# Patient Record
Sex: Male | Born: 1965 | Race: White | Hispanic: No | Smoking: Never smoker
Health system: Southern US, Community
[De-identification: ages and names within clinical notes are randomized; demographics above are authoritative.]

## PROBLEM LIST (undated history)

## (undated) DIAGNOSIS — K8020 Calculus of gallbladder without cholecystitis without obstruction: Secondary | ICD-10-CM

## (undated) DIAGNOSIS — E785 Hyperlipidemia, unspecified: Secondary | ICD-10-CM

## (undated) DIAGNOSIS — Z91013 Allergy to seafood: Secondary | ICD-10-CM

## (undated) DIAGNOSIS — R1013 Epigastric pain: Secondary | ICD-10-CM

## (undated) DIAGNOSIS — J309 Allergic rhinitis, unspecified: Secondary | ICD-10-CM

## (undated) DIAGNOSIS — K219 Gastro-esophageal reflux disease without esophagitis: Secondary | ICD-10-CM

## (undated) HISTORY — DX: Gastro-esophageal reflux disease without esophagitis: K21.9

## (undated) HISTORY — PX: SEPTOPLASTY: SUR1290

## (undated) HISTORY — DX: Allergic rhinitis, unspecified: J30.9

## (undated) HISTORY — DX: Allergy to seafood: Z91.013

## (undated) HISTORY — DX: Epigastric pain: R10.13

## (undated) HISTORY — DX: Hyperlipidemia, unspecified: E78.5

## (undated) HISTORY — PX: WRIST FRACTURE SURGERY: SHX121

---

## 2002-05-07 ENCOUNTER — Encounter: Admission: RE | Admit: 2002-05-07 | Discharge: 2002-05-07 | Payer: Self-pay | Admitting: Family Medicine

## 2002-05-07 ENCOUNTER — Encounter: Payer: Self-pay | Admitting: Family Medicine

## 2005-01-25 ENCOUNTER — Encounter: Admission: RE | Admit: 2005-01-25 | Discharge: 2005-01-25 | Payer: Self-pay | Admitting: Family Medicine

## 2011-01-03 DIAGNOSIS — R1013 Epigastric pain: Secondary | ICD-10-CM

## 2011-01-03 HISTORY — DX: Epigastric pain: R10.13

## 2013-09-16 ENCOUNTER — Telehealth: Payer: Self-pay | Admitting: Internal Medicine

## 2013-09-16 NOTE — Telephone Encounter (Signed)
Caller name: Kevin Santos  Relation to pt: self  Call back number: [phone]   Reason for call:   pt gets hes 6 month liver function test pt is on "crestor" . Its been 9 month since pt last liver test. Pt has an upcoming New Patient Appointment 10/06/13   Dr. Charmian Muff old PCP

## 2013-09-16 NOTE — Telephone Encounter (Signed)
Pt is scheduled for NP appt for 10/5, Pt would like to have his liver tests done before visit, he states he is over due and would not like to wait until 10/5.   Please advise.

## 2013-09-17 ENCOUNTER — Telehealth: Payer: Self-pay | Admitting: Internal Medicine

## 2013-09-17 NOTE — Telephone Encounter (Signed)
Needs OV , see if one of our extenders could see him

## 2013-09-17 NOTE — Telephone Encounter (Signed)
Left vm for pt

## 2013-09-17 NOTE — Telephone Encounter (Signed)
Caller name:Ricard  Relation to pt: self  Call back number: [phone]   Reason for call:   please be aware pt scheduled acute appointment 09/19/13

## 2013-09-17 NOTE — Telephone Encounter (Signed)
Dr. Drue Novel would like to see Pt before any lab work is ordered, however, if the Pt is persistent about getting lab before his new pt appt he can make an acute visit appt with either Sherron Monday, or [place].

## 2013-09-19 ENCOUNTER — Encounter: Payer: Self-pay | Admitting: Internal Medicine

## 2013-09-19 ENCOUNTER — Ambulatory Visit (INDEPENDENT_AMBULATORY_CARE_PROVIDER_SITE_OTHER): Payer: 59 | Admitting: Internal Medicine

## 2013-09-19 VITALS — BP 122/64 | HR 80 | Temp 98.2°F | Ht 69.0 in | Wt 185.2 lb

## 2013-09-19 DIAGNOSIS — E785 Hyperlipidemia, unspecified: Secondary | ICD-10-CM

## 2013-09-19 DIAGNOSIS — K219 Gastro-esophageal reflux disease without esophagitis: Secondary | ICD-10-CM

## 2013-09-19 DIAGNOSIS — Z91013 Allergy to seafood: Secondary | ICD-10-CM

## 2013-09-19 LAB — COMPREHENSIVE METABOLIC PANEL
ALT: 28 U/L (ref 0–53)
AST: 31 U/L (ref 0–37)
Albumin: 4.3 g/dL (ref 3.5–5.2)
Alkaline Phosphatase: 88 U/L (ref 39–117)
BUN: 19 mg/dL (ref 6–23)
CALCIUM: 9.3 mg/dL (ref 8.4–10.5)
CHLORIDE: 105 meq/L (ref 96–112)
CO2: 28 meq/L (ref 19–32)
CREATININE: 1 mg/dL (ref 0.4–1.5)
GFR: 87.16 mL/min (ref >60.00)
GLUCOSE: 91 mg/dL (ref 70–99)
Potassium: 4 mEq/L (ref 3.5–5.1)
SODIUM: 139 meq/L (ref 135–145)
TOTAL PROTEIN: 7.9 g/dL (ref 6.0–8.3)
Total Bilirubin: 0.5 mg/dL (ref 0.2–1.2)

## 2013-09-19 NOTE — Assessment & Plan Note (Addendum)
History of hyperlipidemia, took Crestor for 3 years, recently discontinued it d/t a ill-defined upper abdominal discomfort, unclear to me if there was any relationship. Patient is quite concerned about his liver function tests although they have never been abnormal. Plan: CMP Return to the office as the schedule next month, we'll recheck his cholesterol at that time and consider reintroduce a medication either Crestor or other statin. Continue with healthy diet and exercise. To let me know if the upper abdominal discomfort resurface, ultrasound?

## 2013-09-19 NOTE — Progress Notes (Signed)
Pre visit review using our clinic review tool, if applicable. No additional management support is needed unless otherwise documented below in the visit note. 

## 2013-09-19 NOTE — Patient Instructions (Addendum)
Get your blood work before you leave  see you next moth, fasting

## 2013-09-19 NOTE — Progress Notes (Signed)
   Subjective:    Patient ID: Kevin Santos, male    DOB: August 05, 1965, 48 y.o.   MRN: [id number]  DOS:  09/19/2013 Type of visit - description : new pt, acute,previous  PCP Dr Derrell Lolling [facility]) Patient is concerned about his liver and likes it to be checked. He has been taking Crestor for about 3 years, about a month ago he noted occasional upper, R>L abdominal discomfort like a burning. Not associated with food. He felt it could be a Crestor s/e so he discontinued that medication, since then symptoms are gone.   ROS Denies chest pain or difficulty breathing No nausea, vomiting, diarrhea He is very active. Follows a reasonable diet.   Past Medical History  Diagnosis Date  . Hyperlipidemia   . GERD (gastroesophageal reflux disease)   . Shellfish allergy     Past Surgical History  Procedure Laterality Date  . Wrist fracture surgery    . Septoplasty      History   Social History  . Marital Status: Divorced    Spouse Name: N/A    Number of Children: 4  . Years of Education: N/A   Occupational History  . 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 Smoking status: Never Smoker   . Smokeless tobacco: Never Used  . Alcohol Use: Yes     Comment: Occasional  . Drug Use: No  . Sexual Activity: Not on file   Other Topics Concern  . Not on file   Social History Narrative   Lives by himself, 2 children during the weekends     Family History  Problem Relation Age of Onset  . Renal Disease Father   . Diabetes Other     GP late onset  . CAD Neg Hx   . Colon cancer Neg Hx   . Prostate cancer Neg Hx        Medication List       This list is accurate as of: 09/19/13 11:59 PM.  Always use your most recent med list.               EPINEPHrine 0.3 mg/0.3 mL Soaj injection  Commonly known as:  EPI-PEN  Inject 0.3 mg into the muscle once.           Objective:   Physical Exam BP 122/64  Pulse 80  Temp(Src) 98.2 F (36.8 C)  (Oral)  Ht  (1.753 m)  Wt 185 lb 4 oz (84.029 kg)  BMI 27.34 kg/m2  SpO2 97%  General -- alert, well-developed, NAD.  HEENT-- Not pale or jaundice Lungs -- normal respiratory effort, no intercostal retractions, no accessory muscle use, and normal breath sounds.  Heart-- normal rate, regular rhythm, no murmur.  Abdomen-- Not distended, good bowel sounds,soft, non-tender. No rebound or rigidity.   Extremities-- no pretibial edema bilaterally  Neurologic--  alert & oriented X3. Speech normal, gait appropriate for age, strength symmetric and appropriate for age.  Psych-- Cognition and judgment appear intact. Cooperative with normal attention span and concentration. No anxious or depressed appearing.       Assessment & Plan:

## 2013-09-21 NOTE — Assessment & Plan Note (Signed)
Has a epipen

## 2013-10-01 ENCOUNTER — Telehealth: Payer: Self-pay | Admitting: *Deleted

## 2013-10-01 NOTE — Telephone Encounter (Signed)
Received medical records for patient via mail from [facility]. Records forwarded to Dr. Drue NovelPaz for review. JG//CMA

## 2013-10-06 ENCOUNTER — Ambulatory Visit (INDEPENDENT_AMBULATORY_CARE_PROVIDER_SITE_OTHER): Payer: 59 | Admitting: Internal Medicine

## 2013-10-06 ENCOUNTER — Encounter: Payer: Self-pay | Admitting: Internal Medicine

## 2013-10-06 VITALS — BP 130/74 | HR 78 | Temp 98.0°F | Ht 69.0 in | Wt 187.1 lb

## 2013-10-06 DIAGNOSIS — Z0000 Encounter for general adult medical examination without abnormal findings: Secondary | ICD-10-CM

## 2013-10-06 DIAGNOSIS — J309 Allergic rhinitis, unspecified: Secondary | ICD-10-CM

## 2013-10-06 DIAGNOSIS — K219 Gastro-esophageal reflux disease without esophagitis: Secondary | ICD-10-CM

## 2013-10-06 DIAGNOSIS — E785 Hyperlipidemia, unspecified: Secondary | ICD-10-CM

## 2013-10-06 HISTORY — DX: Allergic rhinitis, unspecified: J30.9

## 2013-10-06 NOTE — Progress Notes (Signed)
   Subjective:    Patient ID: Nelma RothmanRichard J Papadakis, male    DOB: [birth year], 48 y.o.   MRN: [id number]  DOS:  10/06/2013 Type of visit - description : CPX Interval history: Since the last time he was here, he discontinue Crestor. He also has some ill-defined abdominal discomfort, symptoms are gone. He did have for the first time an allergic reaction to the flu shot this year, allergies updated     ROS Denies chest pain or  difficulty breathing No nausea, vomiting, diarrhea or blood in the stools. No cough, sputum production or wheezing. No anxiety-depression  Past Medical History  Diagnosis Date  . Hyperlipidemia   . GERD (gastroesophageal reflux disease)   . Shellfish allergy   . Dyspepsia 2013    treated for H pylori, no EGD, Dr Norma Fredricksonoledo  . Allergic rhinitis 10/06/2013    Past Surgical History  Procedure Laterality Date  . Wrist fracture surgery    . Septoplasty      History   Social History  . Marital Status: Divorced    Spouse Name: N/A    Number of Children: 4  . Years of Education: N/A   Occupational History  . 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 Smoking status: Never Smoker   . Smokeless tobacco: Never Used  . Alcohol Use: Yes     Comment: Occasional  . Drug Use: No  . Sexual Activity: Not on file   Other Topics Concern  . Not on file   Social History Narrative   Lives by himself, 2 children during the weekends     Family History  Problem Relation Age of Onset  . Renal Disease Father   . Diabetes Other     GP late onset  . CAD Neg Hx   . Colon cancer Neg Hx   . Prostate cancer Neg Hx        Medication List       This list is accurate as of: 10/06/13  5:52 PM.  Always use your most recent med list.               EPINEPHrine 0.3 mg/0.3 mL Soaj injection  Commonly known as:  EPI-PEN  Inject 0.3 mg into the muscle once.           Objective:   Physical Exam BP 130/74  Pulse 78  Temp(Src) 98 F  (36.7 C) (Oral)  Ht 5\' 9"  (1.753 m)  Wt 187 lb 2 oz (84.879 kg)  BMI 27.62 kg/m2  SpO2 96%  General -- alert, well-developed, NAD.  Neck --no thyromegaly , normal carotid pulse  HEENT-- Not pale.   Lungs -- normal respiratory effort, no intercostal retractions, no accessory muscle use, and normal breath sounds.  Heart-- normal rate, regular rhythm, no murmur.  Abdomen-- Not distended, good bowel sounds,soft, non-tender.  Extremities-- no pretibial edema bilaterally  Neurologic--  alert & oriented X3. Speech normal, gait appropriate for age, strength symmetric and appropriate for age.  Psych-- Cognition and judgment appear intact. Cooperative with normal attention span and concentration. No anxious or depressed appearing.       Assessment & Plan:   A number of records reviewed, will keep the relevant  Ones and mail the patient the rest for safekeeping

## 2013-10-06 NOTE — Progress Notes (Signed)
Pre visit review using our clinic review tool, if applicable. No additional management support is needed unless otherwise documented below in the visit note. 

## 2013-10-06 NOTE — Patient Instructions (Signed)
Get your blood work before you leave    Please come back to the office in 1 year for a physical exam. Come back fasting    REMINDERS It is extremely important to know what medications you take, please bring all bottles with you (or an accurate medication list) for every visit  If we are ordering labs, XRs or referring you to a specialist : we will always communicate to you the results or the time of the appointment within few days. If you don't hear from us please call the office    

## 2013-10-06 NOTE — Assessment & Plan Note (Signed)
Saw GI, Dr. Norma Fredricksonoledo in 2013 with dyspepsia. -Ultrasound of the abdomen show a question of fatty liver. Otherwise negative. - after some testing was rx for H. Pylori, symptoms decrease. Did not get a EGD. -Was seen recently with   ill-defined   abdominal discomfort, symptoms resolved

## 2013-10-06 NOTE — Assessment & Plan Note (Addendum)
Old records reviewed  09/04/2007: Total cholesterol 248, HDL 50, LDL 193 (baseline) . Patient was prescribed Crestor, FLPs essentially very good since then. 12/02/2012: Total cholesterol 217, LDL 139 HDL 39  Currently taking no cholesterol medication, check FLP , ? restart medication

## 2013-10-06 NOTE — Assessment & Plan Note (Addendum)
Seen by  ENT before, takes Flonase occasionally.

## 2013-10-06 NOTE — Assessment & Plan Note (Signed)
Td 09 Recently had a flu shot and developed a rash. Never had a colonoscopy Labs Diet-exercise discussed

## 2013-10-07 ENCOUNTER — Telehealth: Payer: Self-pay | Admitting: Internal Medicine

## 2013-10-07 LAB — CBC WITH DIFFERENTIAL/PLATELET
BASOS PCT: 0.4 % (ref 0.0–3.0)
Basophils Absolute: 0 10*3/uL (ref 0.0–0.1)
EOS PCT: 2.9 % (ref 0.0–5.0)
Eosinophils Absolute: 0.2 10*3/uL (ref 0.0–0.7)
HEMATOCRIT: 48.4 % (ref 39.0–52.0)
HEMOGLOBIN: 16.6 g/dL (ref 13.0–17.0)
LYMPHS PCT: 22.6 % (ref 12.0–46.0)
Lymphs Abs: 1.8 10*3/uL (ref 0.7–4.0)
MCHC: 34.4 g/dL (ref 30.0–36.0)
MCV: 93.2 fl (ref 78.0–100.0)
MONOS PCT: 7.1 % (ref 3.0–12.0)
Monocytes Absolute: 0.6 10*3/uL (ref 0.1–1.0)
NEUTROS ABS: 5.4 10*3/uL (ref 1.4–7.7)
Neutrophils Relative %: 67 % (ref 43.0–77.0)
Platelets: 280 10*3/uL (ref 150.0–400.0)
RBC: 5.19 Mil/uL (ref 4.22–5.81)
RDW: 13.1 % (ref 11.5–15.5)
WBC: 8.1 10*3/uL (ref 4.0–10.5)

## 2013-10-07 LAB — LIPID PANEL
CHOLESTEROL: 223 mg/dL — AB (ref 0–200)
HDL: 37.9 mg/dL — ABNORMAL LOW (ref >39.00)
LDL Cholesterol: 171 mg/dL — ABNORMAL HIGH (ref 0–99)
NONHDL: 185.1
Total CHOL/HDL Ratio: 6
Triglycerides: 70 mg/dL (ref 0.0–149.0)
VLDL: 14 mg/dL (ref 0.0–40.0)

## 2013-10-07 LAB — TSH: TSH: 1.64 u[IU]/mL (ref 0.35–4.50)

## 2013-10-07 NOTE — Telephone Encounter (Signed)
Please advise 

## 2013-10-07 NOTE — Telephone Encounter (Signed)
Caller name: Graeme Relation to pt: self  Call back number: [phone] Pharmacy:  Reason for call:   Patient requesting last lab results

## 2013-10-09 NOTE — Telephone Encounter (Signed)
LMOM for Pt that LDL is high, Dr. Drue NovelPaz would like for him to eat healthier, exercise, and begin Crestor 5 mg daily at bedtime, informed Pt that we did not have a pharmacy on file and would need to know which pharmacy to send Crestor to. Also left message saying Dr. Drue NovelPaz would like for him to return in 3 months for an FLP, AST, and ALT check.

## 2013-10-09 NOTE — Telephone Encounter (Signed)
Caller name: Issaac  Relation to pt: self Call back number:[phone]  Reason for call:  Pt would like to know the status of his lab work taken 10/06/13

## 2013-10-10 ENCOUNTER — Telehealth: Payer: Self-pay | Admitting: Internal Medicine

## 2013-10-10 MED ORDER — ROSUVASTATIN CALCIUM 5 MG PO TABS: 5.0000 mg | ORAL_TABLET | Freq: Every day | ORAL | Status: DC

## 2013-10-10 NOTE — Addendum Note (Signed)
Addended by: Dorette GrateFAULKNER, Eve Rey C on: 10/10/2013 02:04 PM   Modules accepted: Orders

## 2013-10-10 NOTE — Telephone Encounter (Signed)
Pt calling in regards to results and pharmacy info [facility] [address], [place], [place] [address] [phone]

## 2013-10-10 NOTE — Telephone Encounter (Signed)
Pharmacy added into Pts chart.

## 2014-01-05 ENCOUNTER — Other Ambulatory Visit (INDEPENDENT_AMBULATORY_CARE_PROVIDER_SITE_OTHER): Payer: 59

## 2014-01-05 DIAGNOSIS — E785 Hyperlipidemia, unspecified: Secondary | ICD-10-CM

## 2014-01-05 LAB — LIPID PANEL
CHOL/HDL RATIO: 5
Cholesterol: 200 mg/dL (ref 0–200)
HDL: 42.3 mg/dL (ref >39.00)
LDL CALC: 138 mg/dL — AB (ref 0–99)
NonHDL: 157.7
Triglycerides: 98 mg/dL (ref 0.0–149.0)
VLDL: 19.6 mg/dL (ref 0.0–40.0)

## 2014-01-05 LAB — AST: AST: 21 U/L (ref 0–37)

## 2014-01-05 LAB — ALT: ALT: 28 U/L (ref 0–53)

## 2014-01-06 ENCOUNTER — Ambulatory Visit (INDEPENDENT_AMBULATORY_CARE_PROVIDER_SITE_OTHER): Payer: 59 | Admitting: Family Medicine

## 2014-01-06 VITALS — BP 118/75 | HR 76 | Temp 98.3°F | Wt 186.6 lb

## 2014-01-06 DIAGNOSIS — J0110 Acute frontal sinusitis, unspecified: Secondary | ICD-10-CM

## 2014-01-06 DIAGNOSIS — H538 Other visual disturbances: Secondary | ICD-10-CM

## 2014-01-06 MED ORDER — ROSUVASTATIN CALCIUM 5 MG PO TABS: 5.0000 mg | ORAL_TABLET | Freq: Every day | ORAL | Status: DC

## 2014-01-06 MED ORDER — PREDNISONE 10 MG PO TABS: ORAL_TABLET | ORAL | Status: DC

## 2014-01-06 MED ORDER — AMOXICILLIN-POT CLAVULANATE 875-125 MG PO TABS: 1.0000 | ORAL_TABLET | Freq: Two times a day (BID) | ORAL | Status: DC

## 2014-01-06 NOTE — Progress Notes (Signed)
  Subjective:     Kevin RothmanRichard J Santos is a 49 y.o. male who presents for evaluation of sinus pain. Symptoms include: congestion, headaches, nasal congestion and sinus pressure. Onset of symptoms was several weeks ago. Symptoms have been gradually worsening since that time. Past history is significant for no history of pneumonia or bronchitis. Patient is a non-smoker.  Pt is taking flonase and otc antihistamine.     The following portions of the patient's history were reviewed and updated as appropriate:  He  has a past medical history of Hyperlipidemia; GERD (gastroesophageal reflux disease); Shellfish allergy; Dyspepsia (2013); and Allergic rhinitis (10/06/2013). He  does not have any pertinent problems on file. He  has past surgical history that includes Wrist fracture surgery and Septoplasty. His family history includes Diabetes in his other; Renal Disease in his father. There is no history of CAD, Colon cancer, or Prostate cancer. He  reports that he has never smoked. He has never used smokeless tobacco. He reports that he drinks alcohol. He reports that he does not use illicit drugs. He has a current medication list which includes the following prescription(s): rosuvastatin and epinephrine. Current Outpatient Prescriptions on File Prior to Visit  Medication Sig Dispense Refill  . rosuvastatin (CRESTOR) 5 MG tablet Take 1 tablet (5 mg total) by mouth daily. 30 tablet 9  . EPINEPHrine 0.3 mg/0.3 mL IJ SOAJ injection Inject 0.3 mg into the muscle once.     No current facility-administered medications on file prior to visit.   He is allergic to shellfish allergy and influenza vaccines..  Review of Systems Pertinent items are noted in HPI.   Objective:    BP 118/75 mmHg  Pulse 76  Temp(Src) 98.3 F (36.8 C) (Oral)  Wt 186 lb 9.6 oz (84.641 kg)  SpO2 95% General appearance: alert, cooperative, appears stated age and no distress Ears: R ear --dull,  + fluid Nose: green discharge, moderate  congestion, turbinates red, swollen, sinus tenderness bilateral Throat: lips, mucosa, and tongue normal; teeth and gums normal Neck: mild anterior cervical adenopathy, no adenopathy, no carotid bruit, no JVD, supple, symmetrical, trachea midline and thyroid not enlarged, symmetric, no tenderness/mass/nodules Lungs: clear to auscultation bilaterally Heart: regular rate and rhythm, S1, S2 normal, no murmur, click, rub or gallop Extremities: extremities normal, atraumatic, no cyanosis or edema Lymph nodes: Cervical, supraclavicular, and axillary nodes normal.    Assessment:    Acute bacterial sinusitis.    Plan:    Nasal steroids per medication orders. Antihistamines per medication orders. Augmentin per medication orders.

## 2014-01-06 NOTE — Patient Instructions (Signed)

## 2014-01-06 NOTE — Progress Notes (Signed)
Pre visit review using our clinic review tool, if applicable. No additional management support is needed unless otherwise documented below in the visit note. 

## 2014-01-06 NOTE — Addendum Note (Signed)
Addended by: Dorette GrateFAULKNER, Darcie Mellone C on: 01/06/2014 04:11 PM   Modules accepted: Orders

## 2014-01-07 ENCOUNTER — Encounter: Payer: Self-pay | Admitting: Family Medicine

## 2014-01-13 ENCOUNTER — Telehealth: Payer: Self-pay | Admitting: Internal Medicine

## 2014-01-13 DIAGNOSIS — J0110 Acute frontal sinusitis, unspecified: Secondary | ICD-10-CM

## 2014-01-13 NOTE — Telephone Encounter (Signed)
Caller name:Kevin Santos Relation to ZO:XWRUpt:self Call back number:[phone] [facility]  Reason for call: Requesting RX of predniSONE (DELTASONE) 10 MG tablet to [facility]

## 2014-01-13 NOTE — Telephone Encounter (Signed)
Pt was seen by Dr. Laury AxonLowne on 01/06/2014, she prescribed the Prednisone.

## 2014-01-14 MED ORDER — PREDNISONE 10 MG PO TABS: ORAL_TABLET | ORAL | Status: DC

## 2014-01-14 NOTE — Telephone Encounter (Signed)
Cancelled the Rx with [facility] and sent it to the [facility].      KP

## 2014-01-30 ENCOUNTER — Telehealth: Payer: Self-pay | Admitting: Internal Medicine

## 2014-01-30 ENCOUNTER — Other Ambulatory Visit: Payer: Self-pay | Admitting: Family Medicine

## 2014-01-30 DIAGNOSIS — J329 Chronic sinusitis, unspecified: Secondary | ICD-10-CM

## 2014-01-30 NOTE — Telephone Encounter (Signed)
Patient aware, ref has been placed.      KP

## 2014-01-30 NOTE — Telephone Encounter (Signed)
done

## 2014-01-30 NOTE — Telephone Encounter (Signed)
Please advise      KP 

## 2014-01-30 NOTE — Telephone Encounter (Signed)
Caller name: Ante Relation to pt: self Call back number:  [phone] Pharmacy:  Reason for call:   Patient states that the medication that was prescribed for sinus infection is not working. He states that he would like a referral to an ENT regarding this. He saw Dr. Laury AxonLowne

## 2014-03-18 ENCOUNTER — Telehealth: Payer: Self-pay | Admitting: Internal Medicine

## 2014-03-18 MED ORDER — EPINEPHRINE 0.3 MG/0.3ML IJ SOAJ: 0.3000 mg | Freq: Once | INTRAMUSCULAR | Status: AC

## 2014-03-18 NOTE — Telephone Encounter (Signed)
Epipen refills sent

## 2014-03-18 NOTE — Telephone Encounter (Signed)
Caller name: Bertran Relation to pt: self Call back number: [phone] Pharmacy: [facility]  Reason for call:   Requesting epi pen refill

## 2014-04-13 ENCOUNTER — Other Ambulatory Visit: Payer: Self-pay

## 2014-08-05 ENCOUNTER — Telehealth: Payer: Self-pay | Admitting: Internal Medicine

## 2014-08-05 NOTE — Telephone Encounter (Signed)
Next labs will not be due until his CPE in 10/2014.

## 2014-08-05 NOTE — Telephone Encounter (Signed)
Pt called asking if he is due for 6 month labs. Last labs done 01/2014. Please advise. Pt call back # is [phone].

## 2014-08-05 NOTE — Telephone Encounter (Signed)
Routing error. Please see below. Thank you.

## 2014-08-10 ENCOUNTER — Telehealth: Payer: Self-pay | Admitting: Internal Medicine

## 2014-08-10 NOTE — Telephone Encounter (Signed)
Unable to leave msg for pt

## 2014-08-10 NOTE — Telephone Encounter (Signed)
Caller name: Cypher Paule Relationship to patient: Self  Can be reached: [phone]  Pharmacy:  Reason for call: Spoke with Vicente Males, advised pt of her message. He said okay.

## 2014-09-22 ENCOUNTER — Other Ambulatory Visit: Payer: Self-pay | Admitting: Internal Medicine

## 2014-09-22 ENCOUNTER — Telehealth: Payer: Self-pay | Admitting: Internal Medicine

## 2014-09-22 DIAGNOSIS — H409 Unspecified glaucoma: Secondary | ICD-10-CM

## 2014-09-22 MED ORDER — ROSUVASTATIN CALCIUM 5 MG PO TABS: 5.0000 mg | ORAL_TABLET | Freq: Every day | ORAL | Status: DC

## 2014-09-22 NOTE — Telephone Encounter (Signed)
Crestor sent to pharmacy. Ophthalmology referral placed.

## 2014-09-22 NOTE — Telephone Encounter (Signed)
Relation to pt: self  Call back number:  [phone] Pharmacy: [facility] - [place] - [address] [phone] (Phone) [phone] (Fax)         Reason for call:  Patient requesting a refill rosuvastatin (CRESTOR) 5 MG tablet and requesting a referral to an eye doctor due to glaucoma.patient states please don't refer him to [facility]

## 2014-10-12 ENCOUNTER — Telehealth: Payer: Self-pay | Admitting: Internal Medicine

## 2014-10-12 ENCOUNTER — Encounter: Payer: 59 | Admitting: Internal Medicine

## 2014-10-19 ENCOUNTER — Ambulatory Visit (INDEPENDENT_AMBULATORY_CARE_PROVIDER_SITE_OTHER): Payer: 59 | Admitting: Internal Medicine

## 2014-10-19 ENCOUNTER — Encounter: Payer: Self-pay | Admitting: Internal Medicine

## 2014-10-19 VITALS — BP 106/74 | HR 76 | Temp 98.4°F | Ht 69.0 in | Wt 193.0 lb

## 2014-10-19 DIAGNOSIS — Z0000 Encounter for general adult medical examination without abnormal findings: Secondary | ICD-10-CM | POA: Diagnosis not present

## 2014-10-19 DIAGNOSIS — Z889 Allergy status to unspecified drugs, medicaments and biological substances status: Secondary | ICD-10-CM

## 2014-10-19 DIAGNOSIS — Z09 Encounter for follow-up examination after completed treatment for conditions other than malignant neoplasm: Secondary | ICD-10-CM

## 2014-10-19 NOTE — Progress Notes (Signed)
Pre visit review using our clinic review tool, if applicable. No additional management support is needed unless otherwise documented below in the visit note. 

## 2014-10-19 NOTE — Progress Notes (Signed)
Subjective:    Patient ID: Kevin Santos, male    DOB: February 18, 1965, 49 y.o.   MRN: [id number]  DOS:  10/19/2014 Type of visit - description : CPX Interval history: In general doing well, is concerned about his allergies.    Review of Systems  Constitutional: No fever. No chills. No unexplained wt changes. No unusual sweats  HEENT: No dental problems, no ear discharge, no facial swelling, no voice changes. No eye discharge, no eye  redness , no  intolerance to light   Respiratory: No wheezing , no  difficulty breathing. No cough , no mucus production  Cardiovascular: No CP, no leg swelling , no  Palpitations  GI: no nausea, no vomiting, no diarrhea , no  abdominal pain.  No blood in the stools. No dysphagia, no odynophagia    Endocrine: No polyphagia, no polyuria , no polydipsia  GU: No dysuria, gross hematuria, difficulty urinating. No urinary urgency, no frequency.  Musculoskeletal: No joint swellings or unusual aches or pains  Skin: No change in the color of the skin, palor , no  Rash  Allergic, immunologic: No environmental allergies , no  food allergies. Had an allergic reaction to ibuprofen and the  flu shot. Pt is concerned.  Neurological: No dizziness no  syncope. No headaches. No diplopia, no slurred, no slurred speech, no motor deficits, no facial  Numbness  Hematological: No enlarged lymph nodes, no easy bruising , no unusual bleedings  Psychiatry: No suicidal ideas, no hallucinations, no beavior problems, no confusion.  No unusual/severe anxiety, no depression  Past Medical History  Diagnosis Date  . Hyperlipidemia   . GERD (gastroesophageal reflux disease)   . Shellfish allergy   . Dyspepsia 2013    treated for H pylori, no EGD, Dr Norma Fredricksonoledo  . Allergic rhinitis 10/06/2013    Past Surgical History  Procedure Laterality Date  . Wrist fracture surgery    . Septoplasty      Social History   Social History  . Marital Status: Divorced    Spouse Name:  N/A  . Number of Children: 4  . Years of Education: N/A   Occupational History  . 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 Smoking status: Never Smoker   . Smokeless tobacco: Never Used  . Alcohol Use: Yes     Comment: Occasional  . Drug Use: No  . Sexual Activity: Not on file   Other Topics Concern  . Not on file   Social History Narrative   Lives w/  2 of his children      Family History  Problem Relation Age of Onset  . Renal Disease Father     on HD  . Diabetes Other     GP late onset  . CAD Neg Hx   . Colon cancer Neg Hx   . Prostate cancer Neg Hx       Medication List       This list is accurate as of: 10/19/14  6:02 PM.  Always use your most recent med list.               EPINEPHrine 0.3 mg/0.3 mL Soaj injection  Commonly known as:  EPI-PEN  Inject 0.3 mLs (0.3 mg total) into the muscle once.     rosuvastatin 5 MG tablet  Commonly known as:  CRESTOR  Take 1 tablet (5 mg total) by mouth daily.  Objective:   Physical Exam BP 106/74 mmHg  Pulse 76  Temp(Src) 98.4 F (36.9 C) (Oral)  Ht  (1.753 m)  Wt 193 lb (87.544 kg)  BMI 28.49 kg/m2  SpO2 97% General:   Well developed, well nourished . NAD.  Neck:  Full range of motion. Supple. No  thyromegaly HEENT:  Normocephalic . Face symmetric, atraumatic Lungs:  CTA B Normal respiratory effort, no intercostal retractions, no accessory muscle use. Heart: RRR,  no murmur.  No pretibial edema bilaterally  Abdomen:  Not distended, soft, non-tender. No rebound or rigidity.  Skin: Exposed areas without rash. Not pale. Not jaundice Neurologic:  alert & oriented X3.  Speech normal, gait appropriate for age and unassisted Strength symmetric and appropriate for age.  Psych: Cognition and judgment appear intact.  Cooperative with normal attention span and concentration.  Behavior appropriate. No anxious or depressed appearing.    Assessment &  Plan:   Assessment>  Hyperlipidemia GERD H. pylori, 2013 >>no EGD, treated  (Dr. Norma Fredrickson) Shellfish , flu shot and NSAIDs allergies >>>  EpiPen  Plan High cholesterol: Continue with Crestor Allergies: Patient is concerned about the  Development of allergies in the last 2 years to ibuprofen and a flu shot. Would like to see a specialist, referral to be done.

## 2014-10-19 NOTE — Telephone Encounter (Signed)
Charge please 

## 2014-10-19 NOTE — Assessment & Plan Note (Signed)
Td 09 flu shot-- allergic  Never had a colonoscopy Labs Diet-exercise discussed

## 2014-10-19 NOTE — Telephone Encounter (Signed)
Pt was no show 10/12/14 8:00am, cpe appt, pt has not rescheduled, pt did confirm appt thru auto system, charge or no charge?

## 2014-10-19 NOTE — Patient Instructions (Signed)
Get your blood work before you leave     Next visit  for a physical exam in one year, fasting     Please schedule an appointment at the front desk  

## 2014-10-19 NOTE — Telephone Encounter (Signed)
Per Dr. 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 Thank you.

## 2014-10-19 NOTE — Assessment & Plan Note (Signed)
High cholesterol: Continue with Crestor Allergies: Patient is concerned about the  Development of allergies in the last 2 years to ibuprofen and a flu shot. Would like to see a specialist, referral to be done.

## 2014-10-20 ENCOUNTER — Encounter: Payer: Self-pay | Admitting: Internal Medicine

## 2014-10-20 LAB — TSH: TSH: 1.28 u[IU]/mL (ref 0.35–4.50)

## 2014-10-20 LAB — CBC WITH DIFFERENTIAL/PLATELET
BASOS PCT: 0.4 % (ref 0.0–3.0)
Basophils Absolute: 0 10*3/uL (ref 0.0–0.1)
EOS PCT: 4.1 % (ref 0.0–5.0)
Eosinophils Absolute: 0.3 10*3/uL (ref 0.0–0.7)
HCT: 47.9 % (ref 39.0–52.0)
Hemoglobin: 16.6 g/dL (ref 13.0–17.0)
LYMPHS ABS: 2.3 10*3/uL (ref 0.7–4.0)
Lymphocytes Relative: 29.8 % (ref 12.0–46.0)
MCHC: 34.7 g/dL (ref 30.0–36.0)
MCV: 90.6 fl (ref 78.0–100.0)
MONO ABS: 0.5 10*3/uL (ref 0.1–1.0)
Monocytes Relative: 6.1 % (ref 3.0–12.0)
Neutro Abs: 4.5 10*3/uL (ref 1.4–7.7)
Neutrophils Relative %: 59.6 % (ref 43.0–77.0)
PLATELETS: 268 10*3/uL (ref 150.0–400.0)
RBC: 5.28 Mil/uL (ref 4.22–5.81)
RDW: 13.1 % (ref 11.5–15.5)
WBC: 7.6 10*3/uL (ref 4.0–10.5)

## 2014-10-20 LAB — LIPID PANEL
CHOL/HDL RATIO: 4
Cholesterol: 195 mg/dL (ref 0–200)
HDL: 44.6 mg/dL (ref >39.00)
LDL CALC: 128 mg/dL — AB (ref 0–99)
NONHDL: 150.28
TRIGLYCERIDES: 113 mg/dL (ref 0.0–149.0)
VLDL: 22.6 mg/dL (ref 0.0–40.0)

## 2014-10-20 LAB — COMPREHENSIVE METABOLIC PANEL
ALT: 41 U/L (ref 0–53)
AST: 39 U/L — ABNORMAL HIGH (ref 0–37)
Albumin: 4.2 g/dL (ref 3.5–5.2)
Alkaline Phosphatase: 93 U/L (ref 39–117)
BUN: 17 mg/dL (ref 6–23)
CHLORIDE: 105 meq/L (ref 96–112)
CO2: 31 meq/L (ref 19–32)
Calcium: 9.8 mg/dL (ref 8.4–10.5)
Creatinine, Ser: 0.88 mg/dL (ref 0.40–1.50)
GFR: 98.23 mL/min (ref >60.00)
GLUCOSE: 87 mg/dL (ref 70–99)
POTASSIUM: 4.3 meq/L (ref 3.5–5.1)
Sodium: 141 mEq/L (ref 135–145)
TOTAL PROTEIN: 7.4 g/dL (ref 6.0–8.3)
Total Bilirubin: 0.5 mg/dL (ref 0.2–1.2)

## 2014-10-20 LAB — HIV ANTIBODY (ROUTINE TESTING W REFLEX): HIV: NONREACTIVE

## 2014-10-23 ENCOUNTER — Telehealth: Payer: Self-pay | Admitting: Internal Medicine

## 2014-10-23 NOTE — Telephone Encounter (Signed)
Pt having trouble with mychart. I reset his password and he will try to log in. Requesting call for results today if possible.

## 2014-10-23 NOTE — Telephone Encounter (Signed)
LMOM informing Pt of lab results. Informed him to return call if still having issues with MyChart.

## 2014-10-23 NOTE — Telephone Encounter (Signed)
Notes Recorded by Dorette GrateKaylyn C Faulkner, CMA on 10/21/2014 at 5:22 PM Results released to MyChart. Notes Recorded by Wanda PlumpJose E Paz, MD on 10/21/2014 at 5:09 PM Release these results to MyChart with attached comments  Tamim, your cholesterol is well-controlled with a LDL of 128, all the other tests are essentially normal. These are very good results, continue same medications.

## 2014-11-16 ENCOUNTER — Other Ambulatory Visit: Payer: Self-pay | Admitting: Internal Medicine

## 2015-01-19 MED FILL — ROSUVASTATIN CAL 5 MG TAB: 5 | 30 days supply | Qty: 30 | Fill #1

## 2015-02-12 DIAGNOSIS — R21 Rash and other nonspecific skin eruption: Secondary | ICD-10-CM | POA: Diagnosis not present

## 2015-02-12 DIAGNOSIS — J3081 Allergic rhinitis due to animal (cat) (dog) hair and dander: Secondary | ICD-10-CM | POA: Diagnosis not present

## 2015-02-12 DIAGNOSIS — J301 Allergic rhinitis due to pollen: Secondary | ICD-10-CM | POA: Diagnosis not present

## 2015-02-12 DIAGNOSIS — Z91013 Allergy to seafood: Secondary | ICD-10-CM | POA: Diagnosis not present

## 2015-03-01 MED FILL — ROSUVASTATIN CAL 5 MG TAB: 5 | 30 days supply | Qty: 30 | Fill #2

## 2015-04-27 MED FILL — TRAVATAN Z 0.004% EYE DROP: 0.004 | 30 days supply | Qty: 3 | Fill #1

## 2015-04-27 MED FILL — ROSUVASTATIN CALCIUM 5 MG T: 5 | 30 days supply | Qty: 30 | Fill #3

## 2015-06-07 MED FILL — TRAVATAN Z 0.004% EYE DROP: 0.004 | 30 days supply | Qty: 3 | Fill #2

## 2015-06-07 MED FILL — ROSUVASTATIN CALCIUM 5 MG T: 5 | 30 days supply | Qty: 30 | Fill #4

## 2015-08-23 MED FILL — ROSUVASTATIN CALCIUM 5 MG T: 5 | 30 days supply | Qty: 30 | Fill #5

## 2015-10-14 MED FILL — ROSUVASTATIN CALCIUM 5 MG T: 5 | 30 days supply | Qty: 30 | Fill #6

## 2015-10-15 MED FILL — TRAVATAN Z 0.004% EYE DROP: 0.004 | 25 days supply | Qty: 3 | Fill #0

## 2015-10-22 ENCOUNTER — Encounter: Payer: 59 | Admitting: Internal Medicine

## 2015-11-01 DIAGNOSIS — H40013 Open angle with borderline findings, low risk, bilateral: Secondary | ICD-10-CM | POA: Diagnosis not present

## 2015-12-06 ENCOUNTER — Encounter (HOSPITAL_COMMUNITY): Payer: Self-pay | Admitting: *Deleted

## 2015-12-06 ENCOUNTER — Ambulatory Visit (HOSPITAL_COMMUNITY)
Admission: EM | Admit: 2015-12-06 | Discharge: 2015-12-06 | Disposition: A | Discharge status: Home / Self Care | Payer: Worker's Compensation | Attending: Family Medicine | Admitting: Family Medicine

## 2015-12-06 DIAGNOSIS — S0003XA Contusion of scalp, initial encounter: Secondary | ICD-10-CM | POA: Diagnosis not present

## 2015-12-06 MED ORDER — METAXALONE 800 MG PO TABS: 800.0000 mg | ORAL_TABLET | Freq: Three times a day (TID) | ORAL | 0 refills | Status: DC

## 2015-12-06 NOTE — Discharge Instructions (Signed)
Heat and medicine for neck soreness as needed, ok to work as scheduled, go to [facility] if further problems.

## 2015-12-06 NOTE — ED Triage Notes (Signed)
Was    At  Work       Today  And     Raised    Up  And  Hit  Head   On  Monitor      Pt  Has  A  Headache   And       Neck      Pain         Did  Not  Pass  Out      Got     Some  Nausea          Pt  Reports    Happened      sev   Hours       Had  Visual   Disturbances   Briefly  After    The  Incidents    Now  Is    Awake  And  Alert

## 2015-12-06 NOTE — ED Provider Notes (Signed)
[facility]    CSN: [id number] Arrival date & time: 12/06/15  1728     History   Chief Complaint Chief Complaint  Patient presents with  . Head Injury    HPI Kevin Santos is a [age] y.o. male.   The history is provided by the patient.  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 no radicular sx. Pain details:    Quality:  Sharp   Radiates to: left neck.   Severity:  Mild   Progression:  Improving Chronicity:  New Associated symptoms: neck pain     Past Medical History:  Diagnosis Date  . Allergic rhinitis 10/06/2013  . Dyspepsia 2013   treated for H pylori, no EGD, Dr Norma Fredricksonoledo  . GERD (gastroesophageal reflux disease)   . Hyperlipidemia   . Shellfish allergy     Patient Active Problem List   Diagnosis Date Noted  . PCP NOTES >>> 10/19/2014  . Allergic rhinitis 10/06/2013  . Annual physical exam 10/06/2013  . Hyperlipidemia   . GERD (gastroesophageal reflux disease)   . Shellfish allergy     Past Surgical History:  Procedure Laterality Date  . SEPTOPLASTY    . WRIST FRACTURE SURGERY         Home Medications    Prior to Admission medications   Medication Sig Start Date End Date Taking? Authorizing Provider  EPINEPHrine 0.3 mg/0.3 mL IJ SOAJ injection Inject 0.3 mLs (0.3 mg total) into the muscle once. Patient not taking: Reported on 10/19/2014 03/18/14   Wanda PlumpJose E Paz, MD  rosuvastatin (CRESTOR) 5 MG tablet Take 1 tablet (5 mg total) by mouth daily. 11/16/14   Wanda PlumpJose E Paz, MD    Family History Family History  Problem Relation Age of Onset  . Renal Disease Father     on HD  . Diabetes Other     GP late onset  . CAD Neg Hx   . Colon cancer Neg Hx   . Prostate cancer Neg Hx     Social History Social History  Substance Use Topics  . Smoking status: Never Smoker    . Smokeless tobacco: Never Used  . Alcohol use Yes     Comment: Occasional     Allergies   Shellfish allergy; Ibuprofen; and Influenza vaccines   Review of Systems Review of Systems  Constitutional: Negative.   Respiratory: Negative.   Cardiovascular: Negative.   Gastrointestinal: Negative.   Genitourinary: Negative.   Musculoskeletal: Positive for neck pain.  Neurological: Negative.   All other systems reviewed and are negative.    Physical Exam Triage Vital Signs ED Triage Vitals  Enc Vitals Group     BP 12/06/15 1809 127/90     Pulse Rate 12/06/15 1809 94     Resp 12/06/15 1809 12     Temp 12/06/15 1809 98.6 F (37 C)     Temp Source 12/06/15 1809 Oral     SpO2 12/06/15 1809 97 %     Weight --      Height --      Head Circumference --      Peak Flow --      Pain Score 12/06/15 1829 6     Pain Loc --      Pain Edu? --  Excl. in GC? --    No data found.   Updated Vital Signs BP 127/90 (BP Location: Right Arm)   Pulse 94   Temp 98.6 F (37 C) (Oral)   Resp 12   SpO2 97%   Visual Acuity Right Eye Distance:   Left Eye Distance:   Bilateral Distance:    Right Eye Near:   Left Eye Near:    Bilateral Near:     Physical Exam  Constitutional: He is oriented to person, place, and time. He appears well-developed. No distress.  HENT:  Head: Normocephalic and atraumatic.  Right Ear: External ear normal.  Left Ear: External ear normal.  Mouth/Throat: Oropharynx is clear and moist.  Neck: Normal range of motion. Neck supple. Muscular tenderness present. No neck rigidity. Normal range of motion present.  Cardiovascular: Normal rate, regular rhythm, normal heart sounds and intact distal pulses.   Pulmonary/Chest: Effort normal and breath sounds normal.  Abdominal: Soft. Bowel sounds are normal.  Musculoskeletal: Normal range of motion.  Neurological: He is alert and oriented to person, place, and time. He displays normal reflexes. No cranial nerve  deficit. Coordination normal.  Skin: Skin is warm and dry.  Nursing note and vitals reviewed.    UC Treatments / Results  Labs (all labs ordered are listed, but only abnormal results are displayed) Labs Reviewed - No data to display  EKG  EKG Interpretation None       Radiology No results found.  Procedures Procedures (including critical care time)  Medications Ordered in UC Medications - No data to display   Initial Impression / Assessment and Plan / UC Course  I have reviewed the triage vital signs and the nursing notes.  Pertinent labs & imaging results that were available during my care of the patient were reviewed by me and considered in my medical decision making (see chart for details).  Clinical Course       Final Clinical Impressions(s) / UC Diagnoses   Final diagnoses:  None    New Prescriptions New Prescriptions   No medications on file     Linna HoffJames D Brodrick Curran, MD 12/06/15 [phone]

## 2015-12-08 ENCOUNTER — Telehealth (HOSPITAL_COMMUNITY): Payer: Self-pay | Admitting: Family Medicine

## 2015-12-08 MED ORDER — METAXALONE 800 MG PO TABS: 800.0000 mg | ORAL_TABLET | Freq: Three times a day (TID) | ORAL | 0 refills | Status: DC

## 2015-12-08 MED FILL — METAXALONE 800 MG TABLET: 800 | 7 days supply | Qty: 21 | Fill #0

## 2015-12-08 NOTE — Telephone Encounter (Signed)
Patient requests that Rx be sent to med center because retail cost is less there for him

## 2015-12-29 ENCOUNTER — Encounter: Payer: Self-pay | Admitting: Internal Medicine

## 2015-12-29 ENCOUNTER — Ambulatory Visit (INDEPENDENT_AMBULATORY_CARE_PROVIDER_SITE_OTHER): Payer: 59 | Admitting: Internal Medicine

## 2015-12-29 VITALS — BP 118/76 | HR 75 | Temp 98.2°F | Resp 14 | Ht 69.0 in | Wt 193.1 lb

## 2015-12-29 DIAGNOSIS — Z0000 Encounter for general adult medical examination without abnormal findings: Secondary | ICD-10-CM

## 2015-12-29 NOTE — Patient Instructions (Signed)
GO TO THE LAB : Get the blood work     GO TO THE FRONT DESK Schedule your next appointment for a physical exam in one year, fasting   

## 2015-12-29 NOTE — Progress Notes (Signed)
Pre visit review using our clinic review tool, if applicable. No additional management support is needed unless otherwise documented below in the visit note. 

## 2015-12-29 NOTE — Progress Notes (Signed)
Subjective:    Patient ID: Kevin Santos, male    DOB: 1965/06/27, 50 y.o.   MRN: [id number]  DOS:  12/29/2015 Type of visit - description : cpx Interval history: Few weeks ago had a head injury at work, did not loss consciousness, has some diplopia. Went to urgent care, was cleared. He is now asymptomatic except for minimal discomfort at the left side of the neck.   Review of Systems  Constitutional: No fever. No chills. No unexplained wt changes. No unusual sweats  HEENT: No dental problems, no ear discharge, no facial swelling, no voice changes. No eye discharge, no eye  redness , no  intolerance to light   Respiratory: No wheezing , no  difficulty breathing. No cough , no mucus production  Cardiovascular: No CP, no leg swelling , no  Palpitations  GI: no nausea, no vomiting, no diarrhea , no  abdominal pain.  No blood in the stools. No dysphagia, no odynophagia    Endocrine: No polyphagia, no polyuria , no polydipsia  GU: No dysuria, gross hematuria, difficulty urinating. No urinary urgency, no frequency.  Musculoskeletal: Occasional aches and pains, shoulder, knees after exercise  Skin: No change in the color of the skin, palor , no  Rash  Allergic, immunologic: No environmental allergies , no  food allergies  Neurological: No dizziness no  syncope. No headaches. No diplopia, no slurred, no slurred speech, no motor deficits, no facial  Numbness  Hematological: No enlarged lymph nodes, no easy bruising , no unusual bleedings  Psychiatry: No suicidal ideas, no hallucinations, no beavior problems, no confusion.  No unusual/severe anxiety, no depression  Past Medical History:  Diagnosis Date  . Allergic rhinitis 10/06/2013  . Dyspepsia 2013   treated for H pylori, no EGD, Dr Norma Fredricksonoledo  . GERD (gastroesophageal reflux disease)   . Hyperlipidemia   . Shellfish allergy     Past Surgical History:  Procedure Laterality Date  . SEPTOPLASTY    . WRIST FRACTURE SURGERY       Social History   Social History  . Marital status: Divorced    Spouse name: N/A  . Number of children: 4  . Years of education: N/A   Occupational History  . 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 Smoking status: Never Smoker  . Smokeless tobacco: Never Used  . Alcohol use Yes     Comment: Occasional  . Drug use: No  . Sexual activity: Not on file   Other Topics Concern  . Not on file   Social History Narrative   Lives w/  2 of his children      Family History  Problem Relation Age of Onset  . Renal Disease Father     on HD  . CAD Father   . Diabetes Other     GP late onset  . Colon cancer Neg Hx   . Prostate cancer Neg Hx      Allergies as of 12/29/2015      Reactions   Shellfish Allergy Other (See Comments)   Lip and mouth swelling   Ibuprofen Hives   Influenza Vaccines    Rash-neck-face, lip swelling      Medication List       Accurate as of 12/29/15  3:42 PM. Always use your most recent med list.          EPINEPHrine 0.3 mg/0.3 mL Soaj injection Commonly known as:  EPI-PEN  Inject 0.3 mLs (0.3 mg total) into the muscle once.   metaxalone 800 MG tablet Commonly known as:  SKELAXIN Take 1 tablet (800 mg total) by mouth 3 (three) times daily.   rosuvastatin 5 MG tablet Commonly known as:  CRESTOR Take 1 tablet (5 mg total) by mouth daily.          Objective:   Physical Exam BP 118/76 (BP Location: Left Arm, Patient Position: Sitting, Cuff Size: Normal)   Pulse 75   Temp 98.2 F (36.8 C) (Oral)   Resp 14   Ht 5\' 9"  (1.753 m)   Wt 193 lb 2 oz (87.6 kg)   SpO2 98%   BMI 28.52 kg/m   General:   Well developed, well nourished . NAD.  Neck: No  thyromegaly  HEENT:  Normocephalic . Face symmetric, atraumatic Lungs:  CTA B Normal respiratory effort, no intercostal retractions, no accessory muscle use. Heart: RRR,  no murmur.  No pretibial edema bilaterally  Abdomen:  Not distended, soft,  non-tender. No rebound or rigidity.   Skin: Exposed areas without rash. Not pale. Not jaundice Neurologic:  alert & oriented X3.  Speech normal, gait appropriate for age and unassisted Strength symmetric and appropriate for age.  Psych: Cognition and judgment appear intact.  Cooperative with normal attention span and concentration.  Behavior appropriate. No anxious or depressed appearing.    Assessment & Plan:   Assessment>  Hyperlipidemia GERD Pre-glaucoma ? H. pylori, 2013 >>no EGD, treated  (Dr. Norma Fredricksonoledo) Shellfish , flu shot and NSAIDs allergies >>>  EpiPen  PLAN: Hyperlipidemia: On low-dose Crestor,not fasting but will check labs, is hard for him to come back for blood work only. If cholesterol elevated, recheck in few months. Head Injury: Now asx. RTC one year

## 2015-12-29 NOTE — Assessment & Plan Note (Addendum)
Td 09 flu shot-- allergic  Never had a colonoscopy Labs: BMP, AST, ALT, FLP, CBC Diet-exercise discussed Occasional aches and pains: Recommend to stay active, planning to do yoga which is great.

## 2015-12-30 LAB — CBC WITH DIFFERENTIAL/PLATELET
BASOS PCT: 0.5 % (ref 0.0–3.0)
Basophils Absolute: 0 10*3/uL (ref 0.0–0.1)
EOS ABS: 0.3 10*3/uL (ref 0.0–0.7)
EOS PCT: 3.4 % (ref 0.0–5.0)
HCT: 47.2 % (ref 39.0–52.0)
Hemoglobin: 16.7 g/dL (ref 13.0–17.0)
LYMPHS ABS: 2.4 10*3/uL (ref 0.7–4.0)
Lymphocytes Relative: 27.4 % (ref 12.0–46.0)
MCHC: 35.4 g/dL (ref 30.0–36.0)
MCV: 90.6 fl (ref 78.0–100.0)
MONO ABS: 0.7 10*3/uL (ref 0.1–1.0)
Monocytes Relative: 8.2 % (ref 3.0–12.0)
NEUTROS ABS: 5.3 10*3/uL (ref 1.4–7.7)
Neutrophils Relative %: 60.5 % (ref 43.0–77.0)
PLATELETS: 270 10*3/uL (ref 150.0–400.0)
RBC: 5.21 Mil/uL (ref 4.22–5.81)
RDW: 13.1 % (ref 11.5–15.5)
WBC: 8.8 10*3/uL (ref 4.0–10.5)

## 2015-12-30 LAB — LIPID PANEL
CHOLESTEROL: 247 mg/dL — AB (ref 0–200)
HDL: 42.9 mg/dL (ref >39.00)
NonHDL: 204.02
TRIGLYCERIDES: 263 mg/dL — AB (ref 0.0–149.0)
Total CHOL/HDL Ratio: 6
VLDL: 52.6 mg/dL — AB (ref 0.0–40.0)

## 2015-12-30 LAB — BASIC METABOLIC PANEL
BUN: 19 mg/dL (ref 6–23)
CHLORIDE: 106 meq/L (ref 96–112)
CO2: 32 meq/L (ref 19–32)
CREATININE: 0.92 mg/dL (ref 0.40–1.50)
Calcium: 9.5 mg/dL (ref 8.4–10.5)
GFR: 92.85 mL/min (ref >60.00)
Glucose, Bld: 64 mg/dL — ABNORMAL LOW (ref 70–99)
POTASSIUM: 4.2 meq/L (ref 3.5–5.1)
SODIUM: 142 meq/L (ref 135–145)

## 2015-12-30 LAB — AST: AST: 28 U/L (ref 0–37)

## 2015-12-30 LAB — ALT: ALT: 37 U/L (ref 0–53)

## 2015-12-30 LAB — LDL CHOLESTEROL, DIRECT: LDL DIRECT: 171 mg/dL

## 2015-12-31 NOTE — Assessment & Plan Note (Signed)
Hyperlipidemia: On low-dose Crestor,not fasting but will check labs, is hard for him to come back for blood work only. If cholesterol elevated, recheck in few months. Head Injury: Now asx. RTC one year

## 2016-01-04 ENCOUNTER — Other Ambulatory Visit: Payer: Self-pay

## 2016-01-04 ENCOUNTER — Telehealth: Payer: Self-pay | Admitting: Internal Medicine

## 2016-01-04 DIAGNOSIS — E785 Hyperlipidemia, unspecified: Secondary | ICD-10-CM

## 2016-01-04 NOTE — Progress Notes (Signed)
Enter orders for future FLP, due in 2 months  Received: 3 days ago  Message Contents  Wanda PlumpJose E Paz, MD  Conrad BurlingtonKaylyn Senon [place], [place]

## 2016-01-04 NOTE — Telephone Encounter (Signed)
Pt called in for lab results   CB: [phone]

## 2016-01-04 NOTE — Telephone Encounter (Signed)
Notes Recorded by Wanda PlumpJose E Paz, MD on 01/01/2016 at 11:17 AM EST Releasing results to Mychart with comments: Kevin Santos, your cholesterol is high, last year was 195, now is 247. When we check your blood work you were not fasting so I recommend the following: Continue Crestor, eat healthier, increase your physical activity and come back in 2 months fasting to recheck your cholesterol panel. Simply call the office and make an appointment, you don't need to see me in 2 months when you come back. Please send a message or call if you have questions

## 2016-01-04 NOTE — Telephone Encounter (Signed)
Spoke w/ Pt, informed him of lab results and recommendations. Lab appt scheduled 03/03/2016 at 0700.

## 2016-01-10 DIAGNOSIS — H40013 Open angle with borderline findings, low risk, bilateral: Secondary | ICD-10-CM | POA: Diagnosis not present

## 2016-02-18 DIAGNOSIS — R21 Rash and other nonspecific skin eruption: Secondary | ICD-10-CM | POA: Diagnosis not present

## 2016-02-18 DIAGNOSIS — Z91013 Allergy to seafood: Secondary | ICD-10-CM | POA: Diagnosis not present

## 2016-02-18 DIAGNOSIS — J301 Allergic rhinitis due to pollen: Secondary | ICD-10-CM | POA: Diagnosis not present

## 2016-02-18 DIAGNOSIS — J3081 Allergic rhinitis due to animal (cat) (dog) hair and dander: Secondary | ICD-10-CM | POA: Diagnosis not present

## 2016-03-02 ENCOUNTER — Other Ambulatory Visit (INDEPENDENT_AMBULATORY_CARE_PROVIDER_SITE_OTHER): Payer: 59

## 2016-03-02 DIAGNOSIS — E785 Hyperlipidemia, unspecified: Secondary | ICD-10-CM

## 2016-03-02 LAB — LIPID PANEL
Cholesterol: 214 mg/dL — ABNORMAL HIGH (ref 0–200)
HDL: 34.4 mg/dL — AB (ref >39.00)
LDL CALC: 155 mg/dL — AB (ref 0–99)
NONHDL: 179.42
Total CHOL/HDL Ratio: 6
Triglycerides: 123 mg/dL (ref 0.0–149.0)
VLDL: 24.6 mg/dL (ref 0.0–40.0)

## 2016-03-03 ENCOUNTER — Other Ambulatory Visit: Payer: Self-pay

## 2016-03-03 ENCOUNTER — Telehealth: Payer: Self-pay | Admitting: Internal Medicine

## 2016-03-03 MED ORDER — ROSUVASTATIN CALCIUM 10 MG PO TABS: 10.0000 mg | ORAL_TABLET | Freq: Every day | ORAL | 10 refills | Status: DC

## 2016-03-03 NOTE — Telephone Encounter (Signed)
LMOM informing Pt of lab results and recommendations. Instructed to call if questions/concerns.  

## 2016-03-03 NOTE — Telephone Encounter (Signed)
Relation to ZO:XWRUpt:self Call back number:[phone]   Reason for call:  Patient unable to log into MyChart to view lab results, patient would like a phone and voicemail regarding labs, please advise

## 2016-06-12 DIAGNOSIS — H40013 Open angle with borderline findings, low risk, bilateral: Secondary | ICD-10-CM | POA: Diagnosis not present

## 2016-11-14 ENCOUNTER — Telehealth: Payer: Self-pay | Admitting: Internal Medicine

## 2016-11-14 NOTE — Telephone Encounter (Signed)
Pt needs this Today: ALLERGY to FLU shot was discovered around 2012 or 2013. Pt states needs documentation of FLU SHOT [facility]ERGY [facility]. Pt states call him and he will get the fax number for us OR he can do whatever we need him to do so he can get it turned in today per his employer deadline is today.

## 2016-11-14 NOTE — Telephone Encounter (Signed)
Please advise 

## 2016-11-14 NOTE — Telephone Encounter (Signed)
LMOM informing Pt that letter is ready- however need to know where to send it or informed him that letter available on MyChart for him to print as well.

## 2016-11-14 NOTE — Telephone Encounter (Signed)
Flu shot allergy documented in the chart.  Please fax a copy to the patient's employer per his request

## 2017-01-01 ENCOUNTER — Encounter: Payer: Self-pay | Admitting: Internal Medicine

## 2017-01-04 DIAGNOSIS — H40013 Open angle with borderline findings, low risk, bilateral: Secondary | ICD-10-CM | POA: Diagnosis not present

## 2017-02-12 ENCOUNTER — Telehealth: Payer: Self-pay

## 2017-02-12 DIAGNOSIS — Z0000 Encounter for general adult medical examination without abnormal findings: Secondary | ICD-10-CM

## 2017-02-12 NOTE — Telephone Encounter (Signed)
Labs ordered. Fleeta EmmerShanea- can you contact Pt- labs have been ordered. Needs appt for labs approximately a week before cpe appt please. Thank you.

## 2017-02-12 NOTE — Telephone Encounter (Signed)
Advised patient to have labs a week before his physical exam: CMP, FLP, PSA, TSH, CBC

## 2017-02-12 NOTE — Telephone Encounter (Signed)
Copied from CRM [id number]. Topic: Appointment Scheduling - Scheduling Inquiry for Clinic >> Feb 12, 2017 12:01 PM Herby AbrahamJohnson, Shiquita C wrote: Reason for CRM: pt scheduled his CPE for 04/03/17 at 2:00. Pt would like to know if he is able to come in the morning of to have his labs? Please assist further. Pt says that it is okay to lvm for him.

## 2017-02-12 NOTE — Telephone Encounter (Signed)
Please advise 

## 2017-02-12 NOTE — Telephone Encounter (Signed)
Called and spoke with pt advising that lab orders had been sent in for the week before his cpe. Pt stated that he would call back to schedule once he could look at his work schedule.

## 2017-02-15 NOTE — Telephone Encounter (Signed)
Lab appt scheduled 04/03/2017.

## 2017-04-03 ENCOUNTER — Encounter: Payer: Self-pay | Admitting: Internal Medicine

## 2017-04-03 ENCOUNTER — Other Ambulatory Visit (INDEPENDENT_AMBULATORY_CARE_PROVIDER_SITE_OTHER): Payer: 59

## 2017-04-03 ENCOUNTER — Ambulatory Visit (INDEPENDENT_AMBULATORY_CARE_PROVIDER_SITE_OTHER): Payer: 59 | Admitting: Internal Medicine

## 2017-04-03 VITALS — BP 118/70 | HR 91 | Temp 98.1°F | Resp 14 | Ht 69.0 in | Wt 189.2 lb

## 2017-04-03 DIAGNOSIS — Z0000 Encounter for general adult medical examination without abnormal findings: Secondary | ICD-10-CM

## 2017-04-03 DIAGNOSIS — R1013 Epigastric pain: Secondary | ICD-10-CM | POA: Diagnosis not present

## 2017-04-03 LAB — COMPREHENSIVE METABOLIC PANEL
ALBUMIN: 4 g/dL (ref 3.5–5.2)
ALT: 19 U/L (ref 0–53)
AST: 19 U/L (ref 0–37)
Alkaline Phosphatase: 97 U/L (ref 39–117)
BUN: 20 mg/dL (ref 6–23)
CO2: 31 mEq/L (ref 19–32)
Calcium: 9.7 mg/dL (ref 8.4–10.5)
Chloride: 105 mEq/L (ref 96–112)
Creatinine, Ser: 0.87 mg/dL (ref 0.40–1.50)
GFR: 98.53 mL/min (ref >60.00)
Glucose, Bld: 95 mg/dL (ref 70–99)
POTASSIUM: 4.9 meq/L (ref 3.5–5.1)
SODIUM: 143 meq/L (ref 135–145)
Total Bilirubin: 0.5 mg/dL (ref 0.2–1.2)
Total Protein: 7.1 g/dL (ref 6.0–8.3)

## 2017-04-03 LAB — CBC WITH DIFFERENTIAL/PLATELET
BASOS PCT: 0.3 % (ref 0.0–3.0)
Basophils Absolute: 0 10*3/uL (ref 0.0–0.1)
EOS PCT: 4.7 % (ref 0.0–5.0)
Eosinophils Absolute: 0.3 10*3/uL (ref 0.0–0.7)
HCT: 47.8 % (ref 39.0–52.0)
HEMOGLOBIN: 16.9 g/dL (ref 13.0–17.0)
LYMPHS PCT: 32.5 % (ref 12.0–46.0)
Lymphs Abs: 2.2 10*3/uL (ref 0.7–4.0)
MCHC: 35.3 g/dL (ref 30.0–36.0)
MCV: 90.9 fl (ref 78.0–100.0)
MONOS PCT: 8.7 % (ref 3.0–12.0)
Monocytes Absolute: 0.6 10*3/uL (ref 0.1–1.0)
Neutro Abs: 3.6 10*3/uL (ref 1.4–7.7)
Neutrophils Relative %: 53.8 % (ref 43.0–77.0)
Platelets: 267 10*3/uL (ref 150.0–400.0)
RBC: 5.26 Mil/uL (ref 4.22–5.81)
RDW: 12.6 % (ref 11.5–15.5)
WBC: 6.7 10*3/uL (ref 4.0–10.5)

## 2017-04-03 LAB — LIPID PANEL
Cholesterol: 216 mg/dL — ABNORMAL HIGH (ref 0–200)
HDL: 36.7 mg/dL — AB (ref >39.00)
NonHDL: 179.48
TRIGLYCERIDES: 301 mg/dL — AB (ref 0.0–149.0)
Total CHOL/HDL Ratio: 6
VLDL: 60.2 mg/dL — AB (ref 0.0–40.0)

## 2017-04-03 LAB — PSA: PSA: 1.8 ng/mL (ref 0.10–4.00)

## 2017-04-03 LAB — TSH: TSH: 1.3 u[IU]/mL (ref 0.35–4.50)

## 2017-04-03 LAB — LDL CHOLESTEROL, DIRECT: LDL DIRECT: 134 mg/dL

## 2017-04-03 NOTE — Assessment & Plan Note (Addendum)
-  Td 09.  Declined need a booster, thinks he had it at work. -flu shot: allergic  -- CCS: Never had a colonoscopy, options discussed, prefers a screening colonoscopy, he is going to GI due to dyspepsia, recommend to discuss with them CCS. - prostate ca screening : DRE normal, PSA pending -Labs: CMP, FLP, PSA, TSH, CBC drawn, results pending. -Diet-exercise : doing better , + wt loss  .

## 2017-04-03 NOTE — Patient Instructions (Signed)
  GO TO THE FRONT DESK Schedule your next appointment for a physical exam in 1 year  You need a tetanus shot booster every 10 years  We will call you with the results as soon as available

## 2017-04-03 NOTE — Progress Notes (Signed)
Pre visit review using our clinic review tool, if applicable. No additional management support is needed unless otherwise documented below in the visit note. 

## 2017-04-03 NOTE — Progress Notes (Signed)
Subjective:    Patient ID: Kevin Santos, male    DOB: 04/05/1965, 52 y.o.   MRN: [id number]  DOS:  04/03/2017 Type of visit - description : cpe Interval history: Feels well, no major concerns, he is actually doing better with exercise, eating healthier, has lost some weight   Review of Systems Long history of upper abdominal discomfort, mild, denies blood in the stools, fever chills or unexplained weight loss.  Would like a EGD.  Other than above, a 14 point review of systems is negative    Past Medical History:  Diagnosis Date  . Allergic rhinitis 10/06/2013  . Dyspepsia 2013   treated for H pylori, no EGD, Dr Norma Fredricksonoledo  . GERD (gastroesophageal reflux disease)   . Hyperlipidemia   . Shellfish allergy     Past Surgical History:  Procedure Laterality Date  . SEPTOPLASTY    . WRIST FRACTURE SURGERY      Social History   Socioeconomic History  . Marital status: Divorced    Spouse name: Not on file  . Number of children: 4  . Years of education: Not on file  . Highest education level: Not on file  Occupational History  . 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 Financial resource strain: Not on file  . Food insecurity:    Worry: Not on file    Inability: Not on file  . Transportation needs:    Medical: Not on file    Non-medical: Not on file  Tobacco Use  . Smoking status: Never Smoker  . Smokeless tobacco: Never Used  Substance and Sexual Activity  . Alcohol use: Yes    Comment: Occasional  . Drug use: No  . Sexual activity: Not on file  Lifestyle  . Physical activity:    Days per week: Not on file    Minutes per session: Not on file  . Stress: Not on file  Relationships  . Social connections:    Talks on phone: Not on file    Gets together: Not on file    Attends religious service: Not on file    Active member of club or organization: Not on file    Attends meetings of clubs or organizations: Not on file   Relationship status: Not on file  . Intimate partner violence:    Fear of current or ex partner: Not on file    Emotionally abused: Not on file    Physically abused: Not on file    Forced sexual activity: Not on file  Other Topics Concern  . Not on file  Social History Narrative   Lives w/ girl friend     Family History  Problem Relation Age of Onset  . Renal Disease Father        on HD  . Heart disease Father        pacemaker  . Diabetes Other        GP late onset  . Colon cancer Neg Hx   . Prostate cancer Neg Hx      Allergies as of 04/03/2017      Reactions   Shellfish Allergy Other (See Comments)   Lip and mouth swelling   Ibuprofen Hives   Influenza Vaccines    Rash-neck-face, lip swelling      Medication List        Accurate as of 04/03/17 11:59 PM. Always use your most recent med list.  EPINEPHrine 0.3 mg/0.3 mL Soaj injection Commonly known as:  EPI-PEN Inject 0.3 mLs (0.3 mg total) into the muscle once.   rosuvastatin 10 MG tablet Commonly known as:  CRESTOR Take 1 tablet (10 mg total) by mouth daily.          Objective:   Physical Exam BP 118/70 (BP Location: Left Arm, Patient Position: Sitting, Cuff Size: Small)   Pulse 91   Temp 98.1 F (36.7 C) (Oral)   Resp 14   Ht 5\' 9"  (1.753 m)   Wt 189 lb 4 oz (85.8 kg)   SpO2 96%   BMI 27.95 kg/m  General:   Well developed, well nourished . NAD.  Neck: No  thyromegaly  HEENT:  Normocephalic . Face symmetric, atraumatic Lungs:  CTA B Normal respiratory effort, no intercostal retractions, no accessory muscle use. Heart: RRR,  no murmur.  No pretibial edema bilaterally  Abdomen:  Not distended, soft, non-tender. No rebound or rigidity.   Skin: Exposed areas without rash. Not pale. Not jaundice Rectal: External abnormalities: none. Normal sphincter tone. No rectal masses or tenderness.  Brown stools Prostate: Prostate gland firm and smooth, minimal enlargement, no, nodularity,  tenderness, mass, asymmetry or induration Neurologic:  alert & oriented X3.  Speech normal, gait appropriate for age and unassisted Strength symmetric and appropriate for age.  Psych: Cognition and judgment appear intact.  Cooperative with normal attention span and concentration.  Behavior appropriate. No anxious or depressed appearing.     Assessment & Plan:   Assessment Hyperlipidemia GERD Pre-glaucoma ? H. pylori, 2013 >>no EGD, treated  (Dr. Norma Fredrickson) Shellfish , flu shot and NSAIDs allergies >>>  EpiPen  PLAN: Hyperlipidemia: On Crestor, FLP pending Shellfish allergy: Has an EpiPen Dyspepsia: Ill-defined, going on for years, would like a EGD done, refer to GI.  (He also needs a screening colonoscopy) RTC 1 year

## 2017-04-04 ENCOUNTER — Telehealth: Payer: Self-pay

## 2017-04-04 MED ORDER — ROSUVASTATIN CALCIUM 10 MG PO TABS: 10.0000 mg | ORAL_TABLET | Freq: Every day | ORAL | 3 refills | Status: DC

## 2017-04-04 NOTE — Addendum Note (Signed)
Addended byConrad Savonburg: Nakema Fake D on: 04/04/2017 06:07 PM   Modules accepted: Orders

## 2017-04-04 NOTE — Telephone Encounter (Signed)
Copied from CRM [id number]. Topic: Inquiry >> Apr 04, 2017  3:16 PM Landry MellowFoltz, Melissa J wrote: Reason for CRM: pt is asking for call back on labs please call [phone]

## 2017-04-04 NOTE — Telephone Encounter (Signed)
Please advise 

## 2017-04-04 NOTE — Assessment & Plan Note (Signed)
Hyperlipidemia: On Crestor, FLP pending Shellfish allergy: Has an EpiPen Dyspepsia: Ill-defined, going on for years, would like a EGD done, refer to GI.  (He also needs a screening colonoscopy) RTC 1 year

## 2017-04-04 NOTE — Telephone Encounter (Signed)
Results d/w pt.

## 2017-04-17 ENCOUNTER — Encounter: Payer: Self-pay | Admitting: Gastroenterology

## 2017-05-11 ENCOUNTER — Ambulatory Visit: Payer: Self-pay | Admitting: Gastroenterology

## 2017-06-19 ENCOUNTER — Ambulatory Visit (INDEPENDENT_AMBULATORY_CARE_PROVIDER_SITE_OTHER): Payer: 59 | Admitting: Gastroenterology

## 2017-06-19 ENCOUNTER — Other Ambulatory Visit: Payer: Self-pay

## 2017-06-19 ENCOUNTER — Encounter: Payer: Self-pay | Admitting: Gastroenterology

## 2017-06-19 VITALS — BP 124/76 | HR 70 | Ht 68.0 in | Wt 187.2 lb

## 2017-06-19 DIAGNOSIS — Z1211 Encounter for screening for malignant neoplasm of colon: Secondary | ICD-10-CM | POA: Diagnosis not present

## 2017-06-19 DIAGNOSIS — R1013 Epigastric pain: Secondary | ICD-10-CM | POA: Diagnosis not present

## 2017-06-19 DIAGNOSIS — G8929 Other chronic pain: Secondary | ICD-10-CM | POA: Diagnosis not present

## 2017-06-19 MED ORDER — PEG 3350-KCL-NA BICARB-NACL 420 G PO SOLR: 4000.0000 mL | ORAL | 0 refills | Status: DC

## 2017-06-19 NOTE — Patient Instructions (Addendum)
You will be set up for a colonoscopy for screening. You will be set up for an upper endoscopy for epigastric pain, dyspepsia. You will be set up for an ultrasound epigastric pain  [facility] on 6/25/19at 1030am. Please arrive 15 minutes prior to your appointment for registration. Make certain not to have anything to eat or drink 6 hours prior to your appointment. Should you need to reschedule your appointment, please contact radiology at [phone]. This test typically takes about 30 minutes to perform.  Normal BMI (Body Mass Index- based on height and weight) is between 19 and 25. Your BMI today is Body mass index is 28.47 kg/m. Marland Kitchen. Please consider follow up  regarding your BMI with your Primary Care Provider.

## 2017-06-19 NOTE — Progress Notes (Signed)
HPI: This is a  very pleasant 52 year old man who was referred to me by Wanda PlumpPaz, Jose E, MD  to evaluate chronic epigastric pain, routine risk for colon cancer.    Chief complaint is chronic epigastric pain   Many many years of abd issues. After eating (mayb pork or spicy foods) feels like food sits very heavy in his giut, then stomach expands.  Epigastric in location. He's tried anti acid meds does not seem to help. Often just drikning water helps.  He does not generally get associated nausea or vomiting.  The pain does not really radiate he describes it as blunt, bloating.  The pain occurs 8-10 times per year.  It can last for several hours after it starts.  He has no troubles with his bowels.  No overt GI bleeding and no significant constipation or diarrhea  He's lost 13 pounds in past few, intentionally.  He does not take NSAIDs.  Only rarely   Old Data Reviewed:  04/2017 labs: CBC, cmet were normal.      Review of systems: Pertinent positive and negative review of systems were noted in the above HPI section. All other review negative.   Past Medical History:  Diagnosis Date  . Allergic rhinitis 10/06/2013  . Dyspepsia 2013   treated for H pylori, no EGD, Dr Norma Fredricksonoledo  . GERD (gastroesophageal reflux disease)   . Hyperlipidemia   . Shellfish allergy     Past Surgical History:  Procedure Laterality Date  . SEPTOPLASTY    . WRIST FRACTURE SURGERY      Current Outpatient Medications  Medication Sig Dispense Refill  . EPINEPHrine 0.3 mg/0.3 mL IJ SOAJ injection Inject 0.3 mLs (0.3 mg total) into the muscle once. 1 Device 3  . rosuvastatin (CRESTOR) 10 MG tablet Take 1 tablet (10 mg total) by mouth daily. 90 tablet 3  . TURMERIC PO Take by mouth daily.     No current facility-administered medications for this visit.     Allergies as of 06/19/2017 - Review Complete 06/19/2017  Allergen Reaction Noted  . Shellfish allergy Other (See Comments) 09/19/2013  . Ibuprofen  Hives 10/19/2014  . Influenza vaccines  10/06/2013  . Nsaids  06/19/2017    Family History  Problem Relation Age of Onset  . Renal Disease Father        on HD  . Heart disease Father        pacemaker  . Diabetes Other        GP late onset  . Colon cancer Neg Hx   . Prostate cancer Neg Hx     Social History   Socioeconomic History  . Marital status: Divorced    Spouse name: Not on file  . Number of children: 4  . Years of education: Not on file  . Highest education level: Not on file  Occupational History  . 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 Financial resource strain: Not on file  . Food insecurity:    Worry: Not on file    Inability: Not on file  . Transportation needs:    Medical: Not on file    Non-medical: Not on file  Tobacco Use  . Smoking status: Never Smoker  . Smokeless tobacco: Never Used  Substance and Sexual Activity  . Alcohol use: Yes    Comment: Occasional  . Drug use: No  . Sexual activity: Not on file  Lifestyle  .  Physical activity:    Days per week: Not on file    Minutes per session: Not on file  . Stress: Not on file  Relationships  . Social connections:    Talks on phone: Not on file    Gets together: Not on file    Attends religious service: Not on file    Active member of club or organization: Not on file    Attends meetings of clubs or organizations: Not on file    Relationship status: Not on file  . Intimate partner violence:    Fear of current or ex partner: Not on file    Emotionally abused: Not on file    Physically abused: Not on file    Forced sexual activity: Not on file  Other Topics Concern  . Not on file  Social History Narrative   Lives w/ girl friend     Physical Exam: BP 124/76   Pulse 70   Ht 5\' 8"  (1.727 m)   Wt 187 lb 4 oz (84.9 kg)   BMI 28.47 kg/m  Constitutional: generally well-appearing Psychiatric: alert and oriented x3 Eyes: extraocular movements  intact Mouth: oral pharynx moist, no lesions Neck: supple no lymphadenopathy Cardiovascular: heart regular rate and rhythm Lungs: clear to auscultation bilaterally Abdomen: soft, nontender, nondistended, no obvious ascites, no peritoneal signs, normal bowel sounds Extremities: no lower extremity edema bilaterally Skin: no lesions on visible extremities   Assessment and plan: 52 y.o. male with epigastric discomfort, routine risk for colon cancer  His epigastric symptoms may simply be functional dyspepsia but they are somewhat biliary in nature.  I would like to know if he has gallstones in his gallbladder and I will order an ultrasound to check for that.  He needs colonoscopy for colon cancer screening and at the same time I will proceed with EGD to check for other causes of his dyspepsia, epigastric discomfort.  Pending the results of the above and whether or not he has gallstones he may be referred to general surgery.    Please see the "Patient Instructions" section for addition details about the plan.   Rob Bunting, MD [facility] 06/19/2017, 8:25 AM  Cc: [name], MD

## 2017-06-22 ENCOUNTER — Ambulatory Visit (HOSPITAL_COMMUNITY): Payer: 59

## 2017-06-26 ENCOUNTER — Ambulatory Visit (HOSPITAL_COMMUNITY)
Admission: RE | Admit: 2017-06-26 | Discharge: 2017-06-26 | Disposition: A | Discharge status: Home / Self Care | Payer: 59 | Source: Ambulatory Visit | Attending: Gastroenterology | Admitting: Gastroenterology

## 2017-06-26 DIAGNOSIS — K8020 Calculus of gallbladder without cholecystitis without obstruction: Secondary | ICD-10-CM | POA: Insufficient documentation

## 2017-06-26 DIAGNOSIS — R1013 Epigastric pain: Secondary | ICD-10-CM | POA: Diagnosis not present

## 2017-06-26 DIAGNOSIS — G8929 Other chronic pain: Secondary | ICD-10-CM | POA: Diagnosis not present

## 2017-06-26 DIAGNOSIS — Z1211 Encounter for screening for malignant neoplasm of colon: Secondary | ICD-10-CM

## 2017-07-23 ENCOUNTER — Telehealth: Payer: Self-pay

## 2017-07-23 DIAGNOSIS — K8020 Calculus of gallbladder without cholecystitis without obstruction: Secondary | ICD-10-CM

## 2017-07-23 NOTE — Telephone Encounter (Signed)
Referral placed.

## 2017-07-23 NOTE — Addendum Note (Signed)
Addended byConrad Clifton Hill: Rickey Farrier D on: 07/23/2017 02:41 PM   Modules accepted: Orders

## 2017-07-23 NOTE — Telephone Encounter (Signed)
Copied from CRM [id number]. Topic: Referral - Status >> Jul 23, 2017  2:12 PM Oneal GroutSebastian, Jennifer S wrote: Reason for CRM: Requesting referral to Dr Lovell SheehanJenkins, [place] in [place], for gallbladder.

## 2017-07-25 ENCOUNTER — Telehealth: Payer: Self-pay | Admitting: Internal Medicine

## 2017-07-25 NOTE — Telephone Encounter (Signed)
Cologuard ordered through [employer] online portal. Order number: [id number].

## 2017-07-25 NOTE — Telephone Encounter (Signed)
Copied from CRM [id number]. Topic: Referral - Request >> Jul 25, 2017 10:48 AM Luanna Coleawoud, Jessica L wrote: Reason for CRM:pt would like to have the cologuard test. He is not sure how to get this done. Please advise

## 2017-08-14 ENCOUNTER — Ambulatory Visit: Payer: Self-pay | Admitting: General Surgery

## 2017-09-04 MED FILL — DOXYCYCLINE HYCLATE 100 MG: 100 | 10 days supply | Qty: 20 | Fill #0

## 2017-09-04 MED FILL — EPIPEN 2-PAK 0.3 MG AUTO-IN: 0.3 | 2 days supply | Qty: 2 | Fill #0

## 2017-09-04 MED FILL — predniSONE 10 MG TABS: 10 | 6 days supply | Qty: 21 | Fill #0

## 2017-09-05 DIAGNOSIS — Z1211 Encounter for screening for malignant neoplasm of colon: Secondary | ICD-10-CM | POA: Diagnosis not present

## 2017-09-05 LAB — COLOGUARD: Cologuard: NEGATIVE

## 2017-09-06 ENCOUNTER — Ambulatory Visit (INDEPENDENT_AMBULATORY_CARE_PROVIDER_SITE_OTHER): Payer: 59 | Admitting: General Surgery

## 2017-09-06 ENCOUNTER — Encounter: Payer: Self-pay | Admitting: General Surgery

## 2017-09-06 VITALS — BP 135/91 | HR 86 | Temp 97.3°F | Resp 16 | Wt 185.0 lb

## 2017-09-06 DIAGNOSIS — K8020 Calculus of gallbladder without cholecystitis without obstruction: Secondary | ICD-10-CM | POA: Diagnosis not present

## 2017-09-06 MED ORDER — LANSOPRAZOLE 30 MG PO CPDR: 30.0000 mg | DELAYED_RELEASE_CAPSULE | Freq: Every day | ORAL | 1 refills | Status: DC

## 2017-09-06 MED ORDER — CLARITHROMYCIN 500 MG PO TABS: 500.0000 mg | ORAL_TABLET | Freq: Two times a day (BID) | ORAL | 0 refills | Status: DC

## 2017-09-06 MED ORDER — METRONIDAZOLE 500 MG PO TABS: 500.0000 mg | ORAL_TABLET | Freq: Three times a day (TID) | ORAL | 0 refills | Status: DC

## 2017-09-06 MED FILL — metroNIDAZOLE 500 MG TABS: 500 | 14 days supply | Qty: 42 | Fill #0

## 2017-09-06 MED FILL — CLARITHROMYCIN 500 MG TAB: 500 | 14 days supply | Qty: 28 | Fill #0

## 2017-09-06 MED FILL — LANSOPRAZOLE DR 30 MG CAPSU: 30 | 60 days supply | Qty: 60 | Fill #0

## 2017-09-06 NOTE — Patient Instructions (Signed)
Helicobacter Pylori Infection Helicobacter pylori infection is an infection in the stomach that is caused by the Helicobacter pylori (H. pylori) bacteria. This type of bacteria often lives in the lining of the stomach. The infection can cause ulcers and irritation (gastritis) in some people. It is the most common cause of ulcers in the stomach (gastric ulcer) and in the upper part of the intestine (duodenal ulcer). Having this infection may also increase the risk of stomach cancer and a type of white blood cell cancer (lymphoma) that affects the stomach. What are the causes? H. pylori is a type of bacteria that is often found in the stomachs of healthy people. The bacteria may be passed from person to person through contact with stool or saliva. It is not known why some people develop ulcers, gastritis, or cancer from the infection. What increases the risk? This condition is more likely to develop in people who:  Have family members with the infection.  Live with many other people, such as in a dormitory.  Are of African, Hispanic, or Asian descent.  What are the signs or symptoms? Most people with this infection do not have symptoms. If you do have symptoms, they may include:  Heartburn.  Stomach pain.  Nausea.  Vomiting.  Blood-tinged vomit.  Loss of appetite.  Bad breath.  How is this diagnosed? This condition may be diagnosed based on your symptoms, a physical exam, and various tests. Tests may include:  Blood tests or stool tests to check for the proteins (antibodies) that your body may produce in response to the bacteria. These tests are the best way to confirm the diagnosis.  A breath test to check for the type of gas that the H. pylori bacteria release after breaking down a substance called urea. For the test, you are asked to drink urea. This test is often done after treatment in order to find out if the treatment worked.  A procedure in which a thin, flexible tube with  a tiny camera at the end is placed into your stomach and upper intestine (upper endoscopy). Your health care provider may also take tissue samples (biopsy) to test for H. pylori and cancer.  How is this treated? Treatment for this condition usually involves taking a combination of medicines (triple therapy) for a couple of weeks. Triple therapy includes one medicine to reduce the acid in your stomach and two types of antibiotic medicines. Many drug combinations have been approved for treatment. Treatment usually kills the H. pylori and reduces your risk of cancer. You may need to be tested for H. pylori again after treatment. In some cases, the treatment may need to be repeated. Follow these instructions at home:  Take over-the-counter and prescription medicines only as told by your health care provider.  Take your antibiotics as told by your health care provider. Do not stop taking the antibiotics even if you start to feel better.  You can do all your usual activities and eat what you usually do.  Take steps to prevent future infections: ? Wash your hands often. ? Make sure the food you eat has been properly prepared. ? Drink water only from clean sources.  Keep all follow-up visits as told by your health care provider. This is important. Contact a health care provider if:  Your symptoms do not get better.  Your symptoms return after treatment. This information is not intended to replace advice given to you by your health care provider. Make sure you discuss any questions   you have with your health care provider. Document Released: 04/12/2015 Document Revised: 05/27/2015 Document Reviewed: 12/31/2013 Elsevier Interactive Patient Education  2018 [employer]. Biliary Colic, Adult Biliary colic is severe pain caused by a problem with a small organ in the upper right part of your belly (gallbladder). The gallbladder stores a digestive fluid produced in the liver (bile) that helps the body  break down fat. Bile and other digestive enzymes are carried from the liver to the small intestine though tube-like structures (bile ducts). The gallbladder and the bile ducts form the biliary tract. Sometimes hard deposits of digestive fluids form in the gallbladder (gallstones) and block the flow of bile from the gallbladder, causing biliary colic. This condition is also called a gallbladder attack. Gallstones can be as small as a grain of sand or as big as a golf ball. There could be just one gallstone in the gallbladder, or there could be many. What are the causes? Biliary colic is usually caused by gallstones. Less often, a tumor could block the flow of bile from the gallbladder and trigger biliary colic. What increases the risk? This condition is more likely to develop in:  Women.  People of Hispanic descent.  People with a family history of gallstones.  People who are obese.  People who suddenly or quickly lose weight.  People who eat a high-calorie, low-fiber diet that is rich in refined carbs (carbohydrates), such as white bread and white rice.  People who have an intestinal disease that affects nutrient absorption, such as Crohn disease.  People who have a metabolic condition, such as metabolic syndrome or diabetes.  What are the signs or symptoms? Severe pain in the upper right side of the belly is the main symptom of biliary colic. You may feel this pain below the chest but above the hip. This pain often occurs at night or after eating a very fatty meal. This pain may get worse for up to an hour and last as long as 12 hours. In most cases, the pain fades (subsides) within a couple hours. Other symptoms of this condition include:  Nausea and vomiting.  Pain under the right shoulder.  How is this diagnosed? This condition is diagnosed based on your medical history, your symptoms, and a physical exam. You may have tests, including:  Blood tests to rule out infection or  inflammation of the bile ducts, gallbladder, pancreas, or liver.  Imaging studies such as: ? Ultrasound. ? CT scan. ? MRI.  In some cases, you may need to have an imaging study done using a small amount of radioactive material (nuclear medicine) to confirm the diagnosis. How is this treated? Treatment for this condition may include medicine to relieve your pain or nausea. If you have gallstones that are causing biliary colic, you may need surgery to remove the gallbladder (cholecystectomy). Gallstones can also be dissolved gradually with medicine. It may take months or years before the gallstones are completely gone. Follow these instructions at home:  Take over-the-counter and prescription medicines only as told by your health care provider.  Drink enough fluid to keep your urine clear or pale yellow.  Follow instructions from your health care provider about eating or drinking restrictions. These may include avoiding: ? Fatty, greasy, and fried foods. ? Any foods that make the pain worse. ? Overeating. ? Having a large meal after not eating for a while.  Keep all follow-up visits as told by your health care provider. This is important. How is this prevented?  Steps to prevent this condition include:  Maintaining a healthy body weight.  Getting regular exercise.  Eating a healthy, high-fiber, low-fat diet.  Limiting how much sugar and refined carbs you eat, such as sweets, white flour, and white rice.  Contact a health care provider if:  Your pain lasts more than 5 hours.  You vomit.  You have a fever and chills.  Your pain gets worse. Get help right away if:  Your skin or the whites of your eyes look yellow (jaundice).  Your have tea-colored urine and light-colored stools.  You are dizzy or you faint. This information is not intended to replace advice given to you by your health care provider. Make sure you discuss any questions you have with your health care  provider. Document Released: 05/22/2005 Document Revised: 08/17/2015 Document Reviewed: 07/05/2015 Elsevier Interactive Patient Education  [employer].

## 2017-09-06 NOTE — Progress Notes (Signed)
Kevin Santos; [id number]; Apr 21, 1965   HPI Patient is a 52 year old white male who was referred to my care by Dr. Christella Hartigan for evaluation and treatment of cholelithiasis.  Patient states that he has had increasing epigastric pain and heartburn over the past few months.  He is trying to control this with diet.  It is intermittent in nature.  He denies any fever, chills, or jaundice.  The pain usually stays located in the epigastric region.  He currently has 0 out of 10 abdominal pain.  He was treated for H. pylori in the remote past.  Ultrasound of the gallbladder done in June of this year reveals cholelithiasis with a normal common bile duct. Past Medical History:  Diagnosis Date  . Allergic rhinitis 10/06/2013  . Dyspepsia 2013   treated for H pylori, no EGD, Dr Norma Fredrickson  . GERD (gastroesophageal reflux disease)   . Hyperlipidemia   . Shellfish allergy     Past Surgical History:  Procedure Laterality Date  . SEPTOPLASTY    . WRIST FRACTURE SURGERY      Family History  Problem Relation Age of Onset  . Renal Disease Father        on HD  . Heart disease Father        pacemaker  . Diabetes Other        GP late onset  . Colon cancer Neg Hx   . Prostate cancer Neg Hx     Current Outpatient Medications on File Prior to Visit  Medication Sig Dispense Refill  . doxycycline (DORYX) 100 MG EC tablet Take 100 mg by mouth 2 (two) times daily.    Marland Kitchen EPINEPHrine 0.3 mg/0.3 mL IJ SOAJ injection Inject 0.3 mLs (0.3 mg total) into the muscle once. 1 Device 3  . polyethylene glycol-electrolytes (NULYTELY/GOLYTELY) 420 g solution Take 4,000 mLs by mouth as directed. 4000 mL 0  . predniSONE (DELTASONE) 10 MG tablet Take 10 mg by mouth daily with breakfast.    . rosuvastatin (CRESTOR) 10 MG tablet Take 1 tablet (10 mg total) by mouth daily. 90 tablet 3  . TURMERIC PO Take by mouth daily.     No current facility-administered medications on file prior to visit.     Allergies  Allergen Reactions   . Shellfish Allergy Other (See Comments)    Lip and mouth swelling  . Ibuprofen Hives  . Influenza Vaccines     Rash-neck-face, lip swelling  . Nsaids     Social History   Substance and Sexual Activity  Alcohol Use Yes   Comment: Occasional    Social History   Tobacco Use  Smoking Status Never Smoker  Smokeless Tobacco Never Used    Review of Systems  Constitutional: Negative.   HENT: Positive for sinus pain.   Eyes: Negative.   Respiratory: Negative.   Cardiovascular: Negative.   Gastrointestinal: Positive for abdominal pain and heartburn.  Genitourinary: Negative.   Musculoskeletal: Negative.   Skin: Negative.   Neurological: Negative.   Endo/Heme/Allergies: Negative.   Psychiatric/Behavioral: Negative.     Objective   Vitals:   09/06/17 0907  BP: (!) 135/91  Pulse: 86  Resp: 16  Temp: (!) 97.3 F (36.3 C)    Physical Exam  Constitutional: He is oriented to person, place, and time. He appears well-developed and well-nourished. No distress.  HENT:  Head: Normocephalic and atraumatic.  Eyes: No scleral icterus.  Cardiovascular: Normal rate, regular rhythm and normal heart sounds. Exam reveals no gallop and no friction  rub.  No murmur heard. Pulmonary/Chest: Effort normal and breath sounds normal. No stridor. No respiratory distress. He has no wheezes. He has no rhonchi. He has no rales.  Abdominal: Soft. Normal appearance and bowel sounds are normal. He exhibits no distension and no mass. There is no hepatosplenomegaly. There is no tenderness. There is no rigidity, no rebound, no guarding and negative Murphy's sign.  Neurological: He is alert and oriented to person, place, and time.  Skin: Skin is warm and dry.  Vitals reviewed. Ultrasound report reviewed  Assessment  Cholelithiasis, somewhat atypical presentation.  History of H. pylori infection Plan   At this point, will empirically treat for H. pylori.  Should this not resolve his symptoms, then  we will proceed with laparoscopic cholecystectomy.  Patient understands and agrees to the treatment plan.  Further management pending the results of the treatment.  Patient has been placed on Prevacid, Flagyl, and Biaxin for 2 weeks.

## 2017-09-14 ENCOUNTER — Telehealth: Payer: Self-pay | Admitting: *Deleted

## 2017-09-14 NOTE — Telephone Encounter (Signed)
Received Cologuard Results; forwarded to provider/SLS 09/13

## 2017-09-18 ENCOUNTER — Encounter: Payer: Self-pay | Admitting: Internal Medicine

## 2017-10-04 ENCOUNTER — Telehealth: Payer: Self-pay | Admitting: Internal Medicine

## 2017-10-04 NOTE — Telephone Encounter (Signed)
Spoke w/ Pt- informed Cologuard is normal/negative. Repeat 3 years. Pt verbalized understanding.

## 2017-10-04 NOTE — Telephone Encounter (Signed)
Copied from CRM [id number]. Topic: Inquiry >> Oct 04, 2017 11:25 AM Maia Petties wrote: Reason for CRM: pt calling in for cologuard results - he was unable to pull up on mychart.

## 2017-10-26 ENCOUNTER — Telehealth: Payer: Self-pay | Admitting: Internal Medicine

## 2017-10-26 NOTE — Telephone Encounter (Signed)
Copied from CRM [id number]. Topic: Quick Communication - See Telephone Encounter >> Oct 26, 2017  4:11 PM Jens Som A wrote: CRM for notification. See Telephone encounter for: 10/26/17.  Patient is calling to request flu shot exemption as proof of employment please fax [phone]

## 2017-10-29 NOTE — Telephone Encounter (Signed)
Letter faxed.

## 2018-01-13 ENCOUNTER — Other Ambulatory Visit: Payer: Self-pay

## 2018-01-13 ENCOUNTER — Encounter (HOSPITAL_COMMUNITY): Payer: Self-pay | Admitting: Emergency Medicine

## 2018-01-13 ENCOUNTER — Emergency Department (HOSPITAL_COMMUNITY): Payer: 59

## 2018-01-13 ENCOUNTER — Observation Stay (HOSPITAL_COMMUNITY)
Admission: EM | Admit: 2018-01-13 | Discharge: 2018-01-14 | Disposition: A | Discharge status: Home / Self Care | Payer: 59 | Attending: Internal Medicine | Admitting: Internal Medicine

## 2018-01-13 DIAGNOSIS — K210 Gastro-esophageal reflux disease with esophagitis: Secondary | ICD-10-CM | POA: Diagnosis not present

## 2018-01-13 DIAGNOSIS — B9681 Helicobacter pylori [H. pylori] as the cause of diseases classified elsewhere: Secondary | ICD-10-CM | POA: Diagnosis not present

## 2018-01-13 DIAGNOSIS — R079 Chest pain, unspecified: Secondary | ICD-10-CM

## 2018-01-13 DIAGNOSIS — Z79899 Other long term (current) drug therapy: Secondary | ICD-10-CM | POA: Diagnosis not present

## 2018-01-13 DIAGNOSIS — Z887 Allergy status to serum and vaccine status: Secondary | ICD-10-CM | POA: Insufficient documentation

## 2018-01-13 DIAGNOSIS — K8020 Calculus of gallbladder without cholecystitis without obstruction: Secondary | ICD-10-CM | POA: Diagnosis not present

## 2018-01-13 DIAGNOSIS — R110 Nausea: Secondary | ICD-10-CM | POA: Diagnosis not present

## 2018-01-13 DIAGNOSIS — R945 Abnormal results of liver function studies: Secondary | ICD-10-CM | POA: Diagnosis not present

## 2018-01-13 DIAGNOSIS — K449 Diaphragmatic hernia without obstruction or gangrene: Secondary | ICD-10-CM | POA: Diagnosis not present

## 2018-01-13 DIAGNOSIS — R42 Dizziness and giddiness: Secondary | ICD-10-CM | POA: Diagnosis not present

## 2018-01-13 DIAGNOSIS — E785 Hyperlipidemia, unspecified: Secondary | ICD-10-CM | POA: Diagnosis not present

## 2018-01-13 DIAGNOSIS — E876 Hypokalemia: Secondary | ICD-10-CM | POA: Diagnosis not present

## 2018-01-13 DIAGNOSIS — Z886 Allergy status to analgesic agent status: Secondary | ICD-10-CM | POA: Diagnosis not present

## 2018-01-13 DIAGNOSIS — Z8249 Family history of ischemic heart disease and other diseases of the circulatory system: Secondary | ICD-10-CM | POA: Insufficient documentation

## 2018-01-13 DIAGNOSIS — R109 Unspecified abdominal pain: Secondary | ICD-10-CM | POA: Diagnosis not present

## 2018-01-13 DIAGNOSIS — K228 Other specified diseases of esophagus: Secondary | ICD-10-CM | POA: Diagnosis not present

## 2018-01-13 DIAGNOSIS — R0789 Other chest pain: Secondary | ICD-10-CM | POA: Insufficient documentation

## 2018-01-13 DIAGNOSIS — K219 Gastro-esophageal reflux disease without esophagitis: Secondary | ICD-10-CM | POA: Diagnosis present

## 2018-01-13 DIAGNOSIS — K2970 Gastritis, unspecified, without bleeding: Secondary | ICD-10-CM | POA: Diagnosis not present

## 2018-01-13 DIAGNOSIS — Z91013 Allergy to seafood: Secondary | ICD-10-CM | POA: Diagnosis not present

## 2018-01-13 DIAGNOSIS — R1013 Epigastric pain: Secondary | ICD-10-CM | POA: Diagnosis not present

## 2018-01-13 DIAGNOSIS — R7989 Other specified abnormal findings of blood chemistry: Secondary | ICD-10-CM | POA: Diagnosis present

## 2018-01-13 HISTORY — DX: Calculus of gallbladder without cholecystitis without obstruction: K80.20

## 2018-01-13 LAB — BASIC METABOLIC PANEL
Anion gap: 12 (ref 5–15)
BUN: 13 mg/dL (ref 6–20)
CO2: 20 mmol/L — ABNORMAL LOW (ref 22–32)
Calcium: 9.1 mg/dL (ref 8.9–10.3)
Chloride: 103 mmol/L (ref 98–111)
Creatinine, Ser: 0.91 mg/dL (ref 0.61–1.24)
GFR calc Af Amer: >60 mL/min (ref >60)
GFR calc non Af Amer: >60 mL/min (ref >60)
Glucose, Bld: 134 mg/dL — ABNORMAL HIGH (ref 70–99)
Potassium: 2.9 mmol/L — ABNORMAL LOW (ref 3.5–5.1)
Sodium: 135 mmol/L (ref 135–145)

## 2018-01-13 LAB — HEPATIC FUNCTION PANEL
ALT: 38 U/L (ref 0–44)
AST: 54 U/L — ABNORMAL HIGH (ref 15–41)
Albumin: 3.8 g/dL (ref 3.5–5.0)
Alkaline Phosphatase: 73 U/L (ref 38–126)
Bilirubin, Direct: 0.1 mg/dL (ref 0.0–0.2)
Indirect Bilirubin: 0.5 mg/dL (ref 0.3–0.9)
TOTAL PROTEIN: 6.7 g/dL (ref 6.5–8.1)
Total Bilirubin: 0.6 mg/dL (ref 0.3–1.2)

## 2018-01-13 LAB — LIPASE, BLOOD: Lipase: 62 U/L — ABNORMAL HIGH (ref 11–51)

## 2018-01-13 LAB — CBC
HCT: 44.4 % (ref 39.0–52.0)
HEMOGLOBIN: 15.7 g/dL (ref 13.0–17.0)
MCH: 31.6 pg (ref 26.0–34.0)
MCHC: 35.4 g/dL (ref 30.0–36.0)
MCV: 89.3 fL (ref 80.0–100.0)
NRBC: 0 % (ref 0.0–0.2)
Platelets: 288 10*3/uL (ref 150–400)
RBC: 4.97 MIL/uL (ref 4.22–5.81)
RDW: 12.5 % (ref 11.5–15.5)
WBC: 11.8 10*3/uL — ABNORMAL HIGH (ref 4.0–10.5)

## 2018-01-13 LAB — D-DIMER, QUANTITATIVE: D-Dimer, Quant: <0.27 ug/mL-FEU (ref 0.00–0.50)

## 2018-01-13 LAB — I-STAT TROPONIN, ED: Troponin i, poc: 0 ng/mL (ref 0.00–0.08)

## 2018-01-13 MED ORDER — ALUM & MAG HYDROXIDE-SIMETH 200-200-20 MG/5ML PO SUSP
30.0000 mL | Freq: Once | ORAL | Status: AC
  Administered 2018-01-13: 30 mL via ORAL
  Filled 2018-01-13: qty 30

## 2018-01-13 MED ORDER — FENTANYL CITRATE (PF) 100 MCG/2ML IJ SOLN
50.0000 ug | Freq: Once | INTRAMUSCULAR | Status: AC
  Administered 2018-01-13: 50 ug via INTRAVENOUS
  Filled 2018-01-13: qty 2

## 2018-01-13 MED ORDER — FAMOTIDINE IN NACL 20-0.9 MG/50ML-% IV SOLN
20.0000 mg | Freq: Once | INTRAVENOUS | Status: AC
  Administered 2018-01-13: 20 mg via INTRAVENOUS
  Filled 2018-01-13: qty 50

## 2018-01-13 MED ORDER — MORPHINE SULFATE (PF) 4 MG/ML IV SOLN
4.0000 mg | Freq: Once | INTRAVENOUS | Status: AC
  Administered 2018-01-13: 4 mg via INTRAVENOUS
  Filled 2018-01-13: qty 1

## 2018-01-13 MED ORDER — ONDANSETRON HCL 4 MG/2ML IJ SOLN
4.0000 mg | Freq: Once | INTRAMUSCULAR | Status: AC
  Administered 2018-01-13: 4 mg via INTRAVENOUS
  Filled 2018-01-13: qty 2

## 2018-01-13 MED ORDER — IOHEXOL 300 MG/ML  SOLN
100.0000 mL | Freq: Once | INTRAMUSCULAR | Status: AC | PRN
  Administered 2018-01-13: 100 mL via INTRAVENOUS

## 2018-01-13 MED ORDER — SODIUM CHLORIDE 0.9 % IV BOLUS
500.0000 mL | Freq: Once | INTRAVENOUS | Status: AC
  Administered 2018-01-13: 500 mL via INTRAVENOUS

## 2018-01-13 MED ORDER — SUCRALFATE 1 GM/10ML PO SUSP
1.0000 g | Freq: Three times a day (TID) | ORAL | Status: DC
  Administered 2018-01-13 – 2018-01-14 (×3): 1 g via ORAL
  Filled 2018-01-13 (×4): qty 10

## 2018-01-13 MED ORDER — LIDOCAINE VISCOUS HCL 2 % MT SOLN
15.0000 mL | Freq: Once | OROMUCOSAL | Status: AC
  Administered 2018-01-13: 15 mL via ORAL
  Filled 2018-01-13: qty 15

## 2018-01-13 MED ORDER — MORPHINE SULFATE (PF) 2 MG/ML IV SOLN
2.0000 mg | Freq: Once | INTRAVENOUS | Status: AC
  Administered 2018-01-13: 2 mg via INTRAVENOUS
  Filled 2018-01-13: qty 1

## 2018-01-13 NOTE — ED Provider Notes (Signed)
[facility] EMERGENCY DEPARTMENT Provider Note   CSN: [id number] Arrival date & time: 01/13/18  2058     History   Chief Complaint Chief Complaint  Patient presents with  . Abdominal Pain    HPI Kevin Santos is a 53 y.o. male with sudden onset epigastric and lower chest pain approximately 1 hour prior to arrival.  Patient states that he was eating stirfry just before onset of pain.  Patient states that he has a history of indigestion and has had similar pain in the past but is never been this severe.  He states that he attempted to use Tylenol for the pain however had one episode of nonbloody/nonbilious vomiting shortly thereafter.  Patient states that he then drove himself to the emergency department for further evaluation.  Additionally patient endorses lightheadedness and tingling to both of his hands.  Patient states that he has a history of high cholesterol however has been taken off cholesterol medications due to recent diet and exercise changes. Patient reports that his father died of an MI at age 52.  HPI  Past Medical History:  Diagnosis Date  . Allergic rhinitis 10/06/2013  . Dyspepsia 2013   treated for H pylori, no EGD, Dr Norma Fredricksonoledo  . GERD (gastroesophageal reflux disease)   . Hyperlipidemia   . Shellfish allergy     Patient Active Problem List   Diagnosis Date Noted  . PCP NOTES >>> 10/19/2014  . Allergic rhinitis 10/06/2013  . Annual physical exam 10/06/2013  . Hyperlipidemia   . GERD (gastroesophageal reflux disease)   . Shellfish allergy     Past Surgical History:  Procedure Laterality Date  . SEPTOPLASTY    . WRIST FRACTURE SURGERY        Home Medications    Prior to Admission medications   Medication Sig Start Date End Date Taking? Authorizing Provider  EPINEPHrine 0.3 mg/0.3 mL IJ SOAJ injection Inject 0.3 mLs (0.3 mg total) into the muscle once. 03/18/14  Yes Paz, Nolon RodJose E, MD  lansoprazole (PREVACID) 30 MG capsule Take 1  capsule (30 mg total) by mouth daily at 12 noon. Patient not taking: Reported on 01/14/2018 09/06/17   Franky MachoJenkins, Mark, MD  rosuvastatin (CRESTOR) 10 MG tablet Take 1 tablet (10 mg total) by mouth daily. Patient not taking: Reported on 01/14/2018 04/04/17   Wanda PlumpPaz, Jose E, MD    Family History Family History  Problem Relation Age of Onset  . Renal Disease Father        on HD  . Heart disease Father        pacemaker  . Diabetes Other        GP late onset  . Colon cancer Neg Hx   . Prostate cancer Neg Hx     Social History Social History   Tobacco Use  . Smoking status: Never Smoker  . Smokeless tobacco: Never Used  Substance Use Topics  . Alcohol use: Yes    Comment: Occasional  . Drug use: No     Allergies   Shellfish allergy; Ibuprofen; Influenza vaccines; and Nsaids   Review of Systems Review of Systems  Constitutional: Positive for diaphoresis. Negative for chills and fever.  Eyes: Negative.  Negative for visual disturbance.  Respiratory: Negative.  Negative for cough and shortness of breath.   Cardiovascular: Positive for chest pain.  Gastrointestinal: Positive for abdominal pain and nausea. Negative for blood in stool and diarrhea.  Musculoskeletal: Negative.  Negative for arthralgias and myalgias.  Neurological:  Positive for light-headedness and numbness (Tingling of bilateral hands). Negative for dizziness, syncope, weakness and headaches.  All other systems reviewed and are negative.  Physical Exam Updated Vital Signs BP 118/78   Pulse 78   Temp 97.9 F (36.6 C) (Oral)   Resp (!) 24   SpO2 98%   Physical Exam Constitutional:      General: He is not in acute distress.    Appearance: He is well-developed. He is ill-appearing and diaphoretic.  HENT:     Head: Normocephalic and atraumatic.     Right Ear: External ear normal.     Left Ear: External ear normal.     Nose: Nose normal.     Mouth/Throat:     Lips: Pink.     Mouth: Mucous membranes are moist.      Pharynx: Oropharynx is clear. Uvula midline.  Eyes:     General: Vision grossly intact. Gaze aligned appropriately.     Extraocular Movements: Extraocular movements intact.     Conjunctiva/sclera: Conjunctivae normal.     Pupils: Pupils are equal, round, and reactive to light.  Neck:     Musculoskeletal: Full passive range of motion without pain, normal range of motion and neck supple.     Trachea: Trachea and phonation normal. No tracheal deviation.  Cardiovascular:     Rate and Rhythm: Normal rate and regular rhythm.     Pulses:          Radial pulses are 2+ on the right side and 2+ on the left side.       Dorsalis pedis pulses are 2+ on the right side and 2+ on the left side.       Posterior tibial pulses are 2+ on the right side and 2+ on the left side.     Heart sounds: Normal heart sounds.  Pulmonary:     Effort: Pulmonary effort is normal. No respiratory distress.     Breath sounds: Normal breath sounds and air entry. No decreased breath sounds or rhonchi.  Chest:     Chest wall: No tenderness.  Abdominal:     General: Bowel sounds are normal.     Palpations: Abdomen is soft.     Tenderness: There is abdominal tenderness in the epigastric area. There is no guarding or rebound.  Musculoskeletal: Normal range of motion.     Right lower leg: Normal.     Left lower leg: Normal.  Feet:     Right foot:     Protective Sensation: 3 sites tested. 3 sites sensed.     Left foot:     Protective Sensation: 3 sites tested. 3 sites sensed.  Skin:    General: Skin is warm.  Neurological:     Mental Status: He is alert and oriented to person, place, and time.     GCS: GCS eye subscore is 4. GCS verbal subscore is 5. GCS motor subscore is 6.     Comments: Speech is clear and goal oriented, follows commands Major Cranial nerves without deficit, no facial droop Normal strength in upper and lower extremities bilaterally including dorsiflexion and plantar flexion, strong and equal grip  strength Sensation normal to light touch Moves extremities without ataxia, coordination intact Normal gait  Psychiatric:        Mood and Affect: Mood is anxious.        Behavior: Behavior normal.    ED Treatments / Results  Labs (all labs ordered are listed, but only abnormal results are  displayed) Labs Reviewed  BASIC METABOLIC PANEL - Abnormal; Notable for the following components:      Result Value   Potassium 2.9 (*)    CO2 20 (*)    Glucose, Bld 134 (*)    All other components within normal limits  CBC - Abnormal; Notable for the following components:   WBC 11.8 (*)    All other components within normal limits  LIPASE, BLOOD - Abnormal; Notable for the following components:   Lipase 62 (*)    All other components within normal limits  HEPATIC FUNCTION PANEL - Abnormal; Notable for the following components:   AST 54 (*)    All other components within normal limits  D-DIMER, QUANTITATIVE (NOT AT [facility])  TROPONIN I  I-STAT TROPONIN, ED    EKG EKG Interpretation  Date/Time:  Sunday January 13 2018 22:34:09 EST Ventricular Rate:  79 PR Interval:    QRS Duration: 96 QT Interval:  388 QTC Calculation: 445 R Axis:   75 Text Interpretation:  Sinus rhythm ST elev, probable normal early repol pattern No significant change since last tracing Abnormal ekg Confirmed by Gerhard Munch [phone]) on 01/13/2018 10:51:35 PM   Radiology Ct Abdomen Pelvis W Contrast  Result Date: 01/13/2018 CLINICAL DATA:  Blunt epigastric pain and indigestion 2 hours ago. Nausea and vomiting. EXAM: CT ABDOMEN AND PELVIS WITH CONTRAST TECHNIQUE: Multidetector CT imaging of the abdomen and pelvis was performed using the standard protocol following bolus administration of intravenous contrast. CONTRAST:  OMNIPAQUE IOHEXOL 300 MG/ML  SOLN COMPARISON:  Ultrasound abdomen 06/26/2017 FINDINGS: Lower chest: Lung bases are clear. Fluid in the lower esophagus without distention may represent reflux.  Hepatobiliary: Cholelithiasis. No other inflammatory changes in the gallbladder. No bile duct dilatation. No focal liver lesions. Pancreas: Unremarkable. No pancreatic ductal dilatation or surrounding inflammatory changes. Spleen: Normal in size without focal abnormality. Adrenals/Urinary Tract: Adrenal glands are unremarkable. Kidneys are normal, without renal calculi, focal lesion, or hydronephrosis. Bladder is unremarkable. Stomach/Bowel: Stomach is within normal limits. Appendix appears normal. No evidence of bowel wall thickening, distention, or inflammatory changes. Vascular/Lymphatic: No significant vascular findings are present. No enlarged abdominal or pelvic lymph nodes. Reproductive: Prostate gland is enlarged, measuring 5 cm diameter. Other: No free air or free fluid in the abdomen. Abdominal wall musculature appears intact. Musculoskeletal: Degenerative changes in the spine. IMPRESSION: Cholelithiasis. No evidence of bowel obstruction or inflammation. Prostate gland is enlarged. Electronically Signed   By: Burman Nieves M.D.   On: 01/13/2018 23:59   Dg Chest Port 1 View  Result Date: 01/13/2018 CLINICAL DATA:  Chest pain for 1.5 hours.  Nonsmoker. EXAM: PORTABLE CHEST 1 VIEW COMPARISON:  None. FINDINGS: The heart size and mediastinal contours are within normal limits. Both lungs are clear. The visualized skeletal structures are unremarkable. IMPRESSION: No active disease. Electronically Signed   By: Burman Nieves M.D.   On: 01/13/2018 21:50    Procedures Procedures (including critical care time)  Medications Ordered in ED Medications  sucralfate (CARAFATE) 1 GM/10ML suspension 1 g (1 g Oral Given 01/13/18 2202)  alum & mag hydroxide-simeth (MAALOX/MYLANTA) 200-200-20 MG/5ML suspension 30 mL (30 mLs Oral Given 01/13/18 2230)    And  lidocaine (XYLOCAINE) 2 % viscous mouth solution 15 mL (15 mLs Oral Given 01/13/18 2230)  sodium chloride 0.9 % bolus 500 mL (0 mLs Intravenous Stopped  01/13/18 2232)  famotidine (PEPCID) IVPB 20 mg premix (0 mg Intravenous Stopped 01/13/18 2231)  morphine 4 MG/ML injection 4 mg (4  mg Intravenous Given 01/13/18 2127)  ondansetron [facility]) injection 4 mg (4 mg Intravenous Given 01/13/18 2127)  morphine 2 MG/ML injection 2 mg (2 mg Intravenous Given 01/13/18 2202)  fentaNYL (SUBLIMAZE) injection 50 mcg (50 mcg Intravenous Given 01/13/18 2235)  iohexol (OMNIPAQUE) 300 MG/ML solution 100 mL (100 mLs Intravenous Contrast Given 01/13/18 2250)     Initial Impression / Assessment and Plan / ED Course  I have reviewed the triage vital signs and the nursing notes.  Pertinent labs & imaging results that were available during my care of the patient were reviewed by me and considered in my medical decision making (see chart for details).     53 year old male presenting today for sudden onset epigastric and chest pain after eating stirfry.  Patient with history of indigestion, states similar pain however worse today.  On arrival patient is uncomfortable appearing, diaphoretic.  Lungs clear to auscultation bilaterally vital signs within normal limits, discussed pulses equal and intact bilaterally.  Patient seen and evaluated with Dr. Jeraldine Loots. ---------- EKG without acute findings reviewed by Dr. Jeraldine Loots Initial troponin negative CBC with mild leukocytosis BMP with potassium of 2.9 otherwise nonacute Chest x-ray negative D-dimer negative ------------ Second EKG without acute changes reviewed by Dr. Jeraldine Loots ---------- Lipase slightly elevated LFTs with slightly elevated AST ------- Multiple pain medications and GI cocktail, Carafate and Pepcid given, pain slowly improved over time here in emergency department.  CT scan of the abdomen and pelvis was performed due to concern of possible perforated gastric ulcer. CT abdomen pelvis nonacute Delta Trop scheduled Q3 -------- Case rediscussed with Dr. Jeraldine Loots, plan at this time is to admit patient for  cardiac rule out.  Consult called to hospitalist for admission.  Case discussed with hospitalist for admission.  Initially patient reluctant for admission however he has discussed with his wife who is at bedside and has agreed to come into the hospital for evaluation.  Upon reevaluation patient reports that he is pain-free.  Note: Portions of this report may have been transcribed using voice recognition software. Every effort was made to ensure accuracy; however, inadvertent computerized transcription errors may still be present. Final Clinical Impressions(s) / ED Diagnoses   Final diagnoses:  Chest pain, unspecified type    ED Discharge Orders    None       Elizabeth Palau 01/14/18 0108    Gerhard Munch, MD 01/17/18 2342

## 2018-01-13 NOTE — ED Triage Notes (Signed)
Pt reports blunt epigastric pain and indigestion. Pt reports nausea and 2 episodes of emesis. Pt reports he took 1500 mg of tylenol tonight. Pt diaphoretic, tachypneic.

## 2018-01-13 NOTE — ED Notes (Signed)
Nurse starting IV and will draw labs. 

## 2018-01-14 ENCOUNTER — Observation Stay (HOSPITAL_COMMUNITY): Payer: 59 | Admitting: Certified Registered"

## 2018-01-14 ENCOUNTER — Emergency Department (HOSPITAL_COMMUNITY): Payer: 59

## 2018-01-14 ENCOUNTER — Encounter (HOSPITAL_COMMUNITY): Payer: Self-pay | Admitting: Internal Medicine

## 2018-01-14 ENCOUNTER — Encounter (HOSPITAL_COMMUNITY)
Admission: EM | Disposition: A | Discharge status: Home / Self Care | Payer: Self-pay | Source: Home / Self Care | Attending: Emergency Medicine

## 2018-01-14 DIAGNOSIS — E876 Hypokalemia: Secondary | ICD-10-CM | POA: Diagnosis not present

## 2018-01-14 DIAGNOSIS — K2950 Unspecified chronic gastritis without bleeding: Secondary | ICD-10-CM | POA: Diagnosis not present

## 2018-01-14 DIAGNOSIS — R945 Abnormal results of liver function studies: Secondary | ICD-10-CM | POA: Diagnosis not present

## 2018-01-14 DIAGNOSIS — R1013 Epigastric pain: Secondary | ICD-10-CM

## 2018-01-14 DIAGNOSIS — K449 Diaphragmatic hernia without obstruction or gangrene: Secondary | ICD-10-CM | POA: Diagnosis not present

## 2018-01-14 DIAGNOSIS — K2970 Gastritis, unspecified, without bleeding: Secondary | ICD-10-CM | POA: Diagnosis not present

## 2018-01-14 DIAGNOSIS — R0789 Other chest pain: Secondary | ICD-10-CM | POA: Diagnosis not present

## 2018-01-14 DIAGNOSIS — K219 Gastro-esophageal reflux disease without esophagitis: Secondary | ICD-10-CM

## 2018-01-14 DIAGNOSIS — B9681 Helicobacter pylori [H. pylori] as the cause of diseases classified elsewhere: Secondary | ICD-10-CM | POA: Diagnosis not present

## 2018-01-14 DIAGNOSIS — E785 Hyperlipidemia, unspecified: Secondary | ICD-10-CM

## 2018-01-14 DIAGNOSIS — R7989 Other specified abnormal findings of blood chemistry: Secondary | ICD-10-CM | POA: Diagnosis present

## 2018-01-14 DIAGNOSIS — K8020 Calculus of gallbladder without cholecystitis without obstruction: Secondary | ICD-10-CM

## 2018-01-14 DIAGNOSIS — K228 Other specified diseases of esophagus: Secondary | ICD-10-CM | POA: Diagnosis not present

## 2018-01-14 DIAGNOSIS — K210 Gastro-esophageal reflux disease with esophagitis: Secondary | ICD-10-CM | POA: Diagnosis not present

## 2018-01-14 DIAGNOSIS — R109 Unspecified abdominal pain: Secondary | ICD-10-CM | POA: Diagnosis present

## 2018-01-14 HISTORY — PX: BIOPSY: SHX5522

## 2018-01-14 HISTORY — PX: ESOPHAGOGASTRODUODENOSCOPY (EGD) WITH PROPOFOL: SHX5813

## 2018-01-14 LAB — COMPREHENSIVE METABOLIC PANEL
ALT: 31 U/L (ref 0–44)
AST: 28 U/L (ref 15–41)
Albumin: 3.4 g/dL — ABNORMAL LOW (ref 3.5–5.0)
Alkaline Phosphatase: 65 U/L (ref 38–126)
Anion gap: 6 (ref 5–15)
BUN: 7 mg/dL (ref 6–20)
CO2: 26 mmol/L (ref 22–32)
Calcium: 8.7 mg/dL — ABNORMAL LOW (ref 8.9–10.3)
Chloride: 107 mmol/L (ref 98–111)
Creatinine, Ser: 0.84 mg/dL (ref 0.61–1.24)
GFR calc Af Amer: >60 mL/min (ref >60)
Glucose, Bld: 94 mg/dL (ref 70–99)
Potassium: 4 mmol/L (ref 3.5–5.1)
Sodium: 139 mmol/L (ref 135–145)
Total Bilirubin: 0.8 mg/dL (ref 0.3–1.2)
Total Protein: 6 g/dL — ABNORMAL LOW (ref 6.5–8.1)

## 2018-01-14 LAB — LIPID PANEL
Cholesterol: 197 mg/dL (ref 0–200)
HDL: 42 mg/dL (ref >40)
LDL Cholesterol: 141 mg/dL — ABNORMAL HIGH (ref 0–99)
Total CHOL/HDL Ratio: 4.7 RATIO
Triglycerides: 69 mg/dL (ref <150)
VLDL: 14 mg/dL (ref 0–40)

## 2018-01-14 LAB — TROPONIN I
Troponin I: <0.03 ng/mL (ref <0.03)
Troponin I: <0.03 ng/mL (ref <0.03)
Troponin I: <0.03 ng/mL (ref <0.03)

## 2018-01-14 LAB — CBC
HCT: 42.8 % (ref 39.0–52.0)
Hemoglobin: 14.7 g/dL (ref 13.0–17.0)
MCH: 31.2 pg (ref 26.0–34.0)
MCHC: 34.3 g/dL (ref 30.0–36.0)
MCV: 90.9 fL (ref 80.0–100.0)
Platelets: 239 10*3/uL (ref 150–400)
RBC: 4.71 MIL/uL (ref 4.22–5.81)
RDW: 12.6 % (ref 11.5–15.5)
WBC: 11.7 10*3/uL — ABNORMAL HIGH (ref 4.0–10.5)
nRBC: 0 % (ref 0.0–0.2)

## 2018-01-14 LAB — RAPID URINE DRUG SCREEN, HOSP PERFORMED
Amphetamines: NOT DETECTED
BENZODIAZEPINES: NOT DETECTED
Barbiturates: NOT DETECTED
Cocaine: NOT DETECTED
Opiates: POSITIVE — AB
Tetrahydrocannabinol: NOT DETECTED

## 2018-01-14 LAB — HEMOGLOBIN A1C
Hgb A1c MFr Bld: 5 % (ref 4.8–5.6)
Mean Plasma Glucose: 96.8 mg/dL

## 2018-01-14 LAB — HIV ANTIBODY (ROUTINE TESTING W REFLEX): HIV SCREEN 4TH GENERATION: NONREACTIVE

## 2018-01-14 LAB — ACETAMINOPHEN LEVEL: Acetaminophen (Tylenol), Serum: <10 ug/mL — ABNORMAL LOW (ref 10–30)

## 2018-01-14 LAB — MAGNESIUM: Magnesium: 2 mg/dL (ref 1.7–2.4)

## 2018-01-14 SURGERY — ESOPHAGOGASTRODUODENOSCOPY (EGD) WITH PROPOFOL
Anesthesia: Monitor Anesthesia Care

## 2018-01-14 MED ORDER — ALUM & MAG HYDROXIDE-SIMETH 200-200-20 MG/5ML PO SUSP: 30.0000 mL | Freq: Four times a day (QID) | ORAL | Status: DC | PRN

## 2018-01-14 MED ORDER — ENOXAPARIN SODIUM 40 MG/0.4ML ~~LOC~~ SOLN
40.0000 mg | SUBCUTANEOUS | Status: DC
  Filled 2018-01-14: qty 0.4

## 2018-01-14 MED ORDER — NITROGLYCERIN 0.4 MG SL SUBL: 0.4000 mg | SUBLINGUAL_TABLET | SUBLINGUAL | Status: DC | PRN

## 2018-01-14 MED ORDER — PROPOFOL 10 MG/ML IV BOLUS
INTRAVENOUS | Status: DC | PRN
  Administered 2018-01-14: 80 mg via INTRAVENOUS
  Administered 2018-01-14: 40 mg via INTRAVENOUS
  Administered 2018-01-14: 30 mg via INTRAVENOUS
  Administered 2018-01-14: 70 mg via INTRAVENOUS
  Administered 2018-01-14: 50 mg via INTRAVENOUS

## 2018-01-14 MED ORDER — SENNOSIDES-DOCUSATE SODIUM 8.6-50 MG PO TABS: 1.0000 | ORAL_TABLET | Freq: Every evening | ORAL | Status: DC | PRN

## 2018-01-14 MED ORDER — LIDOCAINE 2% (20 MG/ML) 5 ML SYRINGE
INTRAMUSCULAR | Status: DC | PRN
  Administered 2018-01-14: 50 mg via INTRAVENOUS

## 2018-01-14 MED ORDER — LACTATED RINGERS IV SOLN
INTRAVENOUS | Status: DC
  Administered 2018-01-14: 1000 mL via INTRAVENOUS

## 2018-01-14 MED ORDER — POTASSIUM CHLORIDE 20 MEQ/15ML (10%) PO SOLN
40.0000 meq | Freq: Once | ORAL | Status: AC
  Administered 2018-01-14: 40 meq via ORAL
  Filled 2018-01-14: qty 30

## 2018-01-14 MED ORDER — ZOLPIDEM TARTRATE 5 MG PO TABS: 5.0000 mg | ORAL_TABLET | Freq: Every evening | ORAL | Status: DC | PRN

## 2018-01-14 MED ORDER — EPINEPHRINE 0.3 MG/0.3ML IJ SOAJ
0.3000 mg | INTRAMUSCULAR | Status: DC | PRN
  Filled 2018-01-14: qty 0.3

## 2018-01-14 MED ORDER — POTASSIUM CHLORIDE 10 MEQ/100ML IV SOLN
10.0000 meq | INTRAVENOUS | Status: AC
  Administered 2018-01-14 (×2): 10 meq via INTRAVENOUS
  Filled 2018-01-14 (×2): qty 100

## 2018-01-14 MED ORDER — ONDANSETRON HCL 4 MG/2ML IJ SOLN: 4.0000 mg | Freq: Three times a day (TID) | INTRAMUSCULAR | Status: DC | PRN

## 2018-01-14 MED ORDER — SODIUM CHLORIDE 0.9 % IV BOLUS: 1000.0000 mL | Freq: Once | INTRAVENOUS | Status: DC

## 2018-01-14 MED ORDER — LACTATED RINGERS IV SOLN
INTRAVENOUS | Status: DC | PRN
  Administered 2018-01-14: 11:00:00 via INTRAVENOUS

## 2018-01-14 MED ORDER — MAGNESIUM SULFATE IN D5W 1-5 GM/100ML-% IV SOLN
1.0000 g | Freq: Once | INTRAVENOUS | Status: AC
  Administered 2018-01-14: 1 g via INTRAVENOUS
  Filled 2018-01-14: qty 100

## 2018-01-14 MED ORDER — PANTOPRAZOLE SODIUM 20 MG PO TBEC
20.0000 mg | DELAYED_RELEASE_TABLET | Freq: Two times a day (BID) | ORAL | Status: DC
  Administered 2018-01-14: 20 mg via ORAL
  Filled 2018-01-14: qty 1

## 2018-01-14 MED ORDER — MORPHINE SULFATE (PF) 2 MG/ML IV SOLN: 2.0000 mg | INTRAVENOUS | Status: DC | PRN

## 2018-01-14 MED ORDER — OMEPRAZOLE 40 MG PO CPDR: 40.0000 mg | DELAYED_RELEASE_CAPSULE | Freq: Every day | ORAL | 1 refills | Status: AC

## 2018-01-14 MED ORDER — SODIUM CHLORIDE 0.9 % IV SOLN
INTRAVENOUS | Status: DC
  Administered 2018-01-14: 03:00:00 via INTRAVENOUS

## 2018-01-14 MED FILL — OMEPRAZOLE 40 MG CPDR: 40 | 30 days supply | Qty: 30 | Fill #0

## 2018-01-14 SURGICAL SUPPLY — 15 items

## 2018-01-14 NOTE — Anesthesia Preprocedure Evaluation (Addendum)
Anesthesia Evaluation  Patient identified by MRN, date of birth, ID band Patient awake    Reviewed: Allergy & Precautions, NPO status , Patient's Chart, lab work & pertinent test results  Airway Mallampati: II  TM Distance: >3 FB Neck ROM: Full    Dental no notable dental hx.    Pulmonary neg pulmonary ROS,    Pulmonary exam normal breath sounds clear to auscultation       Cardiovascular negative cardio ROS Normal cardiovascular exam Rhythm:Regular Rate:Normal  ECG: SR, rate 79   Neuro/Psych negative neurological ROS  negative psych ROS   GI/Hepatic Neg liver ROS, GERD  Medicated and Controlled,  Endo/Other  negative endocrine ROS  Renal/GU negative Renal ROS     Musculoskeletal negative musculoskeletal ROS (+)   Abdominal   Peds  Hematology HLD   Anesthesia Other Findings Epigastric pain N/V  Reproductive/Obstetrics                            Anesthesia Physical Anesthesia Plan  ASA: II  Anesthesia Plan: MAC   Post-op Pain Management:    Induction: Intravenous  PONV Risk Score and Plan: 1 and Propofol infusion and Treatment may vary due to age or medical condition  Airway Management Planned: Nasal Cannula  Additional Equipment:   Intra-op Plan:   Post-operative Plan:   Informed Consent: I have reviewed the patients History and Physical, chart, labs and discussed the procedure including the risks, benefits and alternatives for the proposed anesthesia with the patient or authorized representative who has indicated his/her understanding and acceptance.   Dental advisory given  Plan Discussed with: CRNA  Anesthesia Plan Comments:         Anesthesia Quick Evaluation

## 2018-01-14 NOTE — ED Notes (Signed)
Patient transported to Ultrasound 

## 2018-01-14 NOTE — Transfer of Care (Signed)
Immediate Anesthesia Transfer of Care Note  Patient: Kevin Santos  Procedure(s) Performed: ESOPHAGOGASTRODUODENOSCOPY (EGD) WITH PROPOFOL (N/A ) BIOPSY  Patient Location: Endoscopy Unit  Anesthesia Type:MAC  Level of Consciousness: awake and patient cooperative  Airway & Oxygen Therapy: Patient Spontanous Breathing and Patient connected to nasal cannula oxygen  Post-op Assessment: Report given to RN and Post -op Vital signs reviewed and stable  Post vital signs: Reviewed and stable  Last Vitals:  Vitals Value Taken Time  BP 99/54 01/14/2018 11:28 AM  Temp 36.7 C 01/14/2018 11:28 AM  Pulse 89 01/14/2018 11:29 AM  Resp 19 01/14/2018 11:29 AM  SpO2 97 % 01/14/2018 11:29 AM  Vitals shown include unvalidated device data.  Last Pain:  Vitals:   01/14/18 1128  TempSrc: Oral  PainSc: 0-No pain         Complications: No apparent anesthesia complications

## 2018-01-14 NOTE — Op Note (Signed)
[facility] Patient Name: Kevin Santos Procedure Date : 01/14/2018 MRN: [id number] Attending MD: Willaim Rayas. Adela Lank , MD Date of Birth: 1965-11-22 CSN: [id number] Age: 53 Admit Type: Inpatient Procedure:                Upper GI endoscopy Indications:              Epigastric abdominal pain, history of gallstones -                            EGD to rule out upper tract etiology, history of H                            pylori Providers:                Viviann Spare P. Adela Lank, MD, Tillie Fantasia, RN,                            Kandice Robinsons, Technician Referring MD:              Medicines:                Monitored Anesthesia Care Complications:            No immediate complications. Estimated blood loss:                            Minimal. Estimated Blood Loss:     Estimated blood loss was minimal. Procedure:                Pre-Anesthesia Assessment:                           - Prior to the procedure, a History and Physical                            was performed, and patient medications and                            allergies were reviewed. The patient's tolerance of                            previous anesthesia was also reviewed. The risks                            and benefits of the procedure and the sedation                            options and risks were discussed with the patient.                            All questions were answered, and informed consent                            was obtained. Prior Anticoagulants: The patient has  taken no previous anticoagulant or antiplatelet                            agents. ASA Grade Assessment: II - A patient with                            mild systemic disease. After reviewing the risks                            and benefits, the patient was deemed in                            satisfactory condition to undergo the procedure.                           After obtaining informed consent,  the endoscope was                            passed under direct vision. Throughout the                            procedure, the patient's blood pressure, pulse, and                            oxygen saturations were monitored continuously. The                            GIF-H190 ([id number] Olympus gastroscope was                            introduced through the mouth, and advanced to the                            second part of duodenum. The upper GI endoscopy was                            accomplished without difficulty. The patient                            tolerated the procedure well. Scope In: Scope Out: Findings:      Esophagogastric landmarks were identified: the Z-line was found at 38       cm, the gastroesophageal junction was found at 38 cm and the upper       extent of the gastric folds was found at 40 cm from the incisors.      A 2 cm hiatal hernia was present.      LA Grade A esophagitis was found at the distal esophagus.      The Z-line was irregular with a small area of nodular mucosa, I suspect       inflammatory in nature but biopsies were taken with a cold forceps for       histology.      The exam of the esophagus was otherwise normal.      The entire examined stomach was normal. Biopsies were taken with a cold  forceps for Helicobacter pylori testing.      The duodenal bulb and second portion of the duodenum were normal. Impression:               - Esophagogastric landmarks identified.                           - 2 cm hiatal hernia.                           - LA Grade A esophagitis.                           - Z-line irregular as outlined, suspect benign                            inflammatory change. Biopsied.                           - Normal stomach. Biopsied to rule out H pylori.                           - Normal duodenal bulb and second portion of the                            duodenum.                           Overall, inflammatory changes in  the distal                            esophagus are mild, and while this could cause                            epigastric pain, given the severity of his symptoms                            I suspect biliary colic from gallstones is the more                            likely cause. Recommendation:           - patient's symptoms have resolved, I think okay to                            go home (patient's preference is to go home with                            close follow up with his surgeon Dr. Lovell Sheehan for                            cholecystectomyin [place])                           Parke Simmers / low fat diet                           -  Continue present medications.                           - Start omeprazole 40mg  once daily for esophagitis                           - Await pathology results. Procedure Code(s):        --- Professional ---                           [phone], Esophagogastroduodenoscopy, flexible,                            transoral; with biopsy, single or multiple Diagnosis Code(s):        --- Professional ---                           K44.9, Diaphragmatic hernia without obstruction or                            gangrene                           K20.9, Esophagitis, unspecified                           K22.8, Other specified diseases of esophagus                           R10.13, Epigastric pain CPT copyright 2018 [employer]. All rights reserved. The codes documented in this report are preliminary and upon coder review may  be revised to meet current compliance requirements. Viviann Spare P. Ritu Gagliardo, MD 01/14/2018 11:31:17 AM This report has been signed electronically. Number of Addenda: 0

## 2018-01-14 NOTE — ED Notes (Signed)
Pt not in room for blood draw.

## 2018-01-14 NOTE — Anesthesia Postprocedure Evaluation (Signed)
Anesthesia Post Note  Patient: Kevin Santos  Procedure(s) Performed: ESOPHAGOGASTRODUODENOSCOPY (EGD) WITH PROPOFOL (N/A ) BIOPSY     Patient location during evaluation: PACU Anesthesia Type: MAC Level of consciousness: awake and alert Pain management: pain level controlled Vital Signs Assessment: post-procedure vital signs reviewed and stable Respiratory status: spontaneous breathing, nonlabored ventilation, respiratory function stable and patient connected to nasal cannula oxygen Cardiovascular status: stable and blood pressure returned to baseline Postop Assessment: no apparent nausea or vomiting Anesthetic complications: no    Last Vitals:  Vitals:   01/14/18 1140 01/14/18 1158  BP: 127/71 104/64  Pulse:  78  Resp: (!) 23 18  Temp:  37.2 C  SpO2: 98% 97%    Last Pain:  Vitals:   01/14/18 1158  TempSrc: Oral  PainSc:                  Noelle Hoogland P Jerris Fleer

## 2018-01-14 NOTE — Consult Note (Addendum)
[facility] Consult: 9:17 AM 01/14/2018  LOS: 0 days    Referring Provider: Dr Verner MouldJ Vann  Primary Care Physician:  Wanda PlumpPaz, Jose E, MD Primary Gastroenterologist:  Dr. Christella HartiganJacobs     Reason for Consultation:  Epigastric pain   HPI: Kevin Santos is a 53 y.o. male.  Generally healthy man. Long hx indigestion, epigastric discomfort.  + serum H Pylori treated 20 years ago and again in October 2019.   Seen by Dr Christella HartiganJacobs 06/2017.  Though suggested, EGD/screening colonoscopy never scheduled.  Ended up undergoing Cologuard testing which was negative in 09/2017.  Pt not taking PPI as prescribed in past Has episodes of a feeling of fullness which proceeds the acute epigastric pain.  Rarely does he get nausea or vomiting.  He can usually relieve the symptoms with p.o. Tylenol, sitting upright, walking.  Gas-X, antacids, acid suppressing medications do not tend to help.  Previously episodes seem to be related to specific foods, especially fatty foods.  However in recent months, when he has had episodes there was no particular culprit food. Yesterday had a particularly acute episode which initially felt better after Tylenol but recurred.  It had started after eating fried rice which normally does not cause problems.  He took more Tylenol but the pain persisted.  + 5 pheresis.  Pain was triggered by taking a breath.  In the ED morphine did not help.  He received fentanyl and a GI cocktail and it about 15 minutes later the symptoms subsided.  They have recurred slightly but are not severe.  Yesterday he did vomit, nonbloody, partially digested food.  Patient drinks 1 to 2 glasses 3 times a week.  He has been exercising and eating healthy/dieting and has managed to lose 15 pounds and lower his cholesterol to the point he does not need  anticholesterol medications.  LFTs: other than AST 54, were/are normal, Lipase 62 (rr 11 - 51)  WBCs to 11.8.  Troponins negative.    CT ab/pelvis with contrast: cholelithiasis, prostate enlarged.      Abd ultrasound: multiple gallstones, no sonographic findings to suggest acute cholecystitis.     Past Medical History:  Diagnosis Date  . Allergic rhinitis 10/06/2013  . Dyspepsia 2013   treated for H pylori, no EGD, Dr Norma Fredricksonoledo  . Gallstone   . GERD (gastroesophageal reflux disease)   . Hyperlipidemia   . Shellfish allergy     Past Surgical History:  Procedure Laterality Date  . SEPTOPLASTY    . WRIST FRACTURE SURGERY      Prior to Admission medications   Medication Sig Start Date End Date Taking? Authorizing Provider  EPINEPHrine 0.3 mg/0.3 mL IJ SOAJ injection Inject 0.3 mLs (0.3 mg total) into the muscle once. 03/18/14  Yes Paz, Nolon RodJose E, MD  lansoprazole (PREVACID) 30 MG capsule Take 1 capsule (30 mg total) by mouth daily at 12 noon. Patient not taking: Reported on 01/14/2018 09/06/17   Franky MachoJenkins, Mark, MD  rosuvastatin (CRESTOR) 10 MG tablet Take 1 tablet (10 mg total) by mouth daily. Patient not taking:  Reported on 01/14/2018 04/04/17   [name], MD    Scheduled Meds: . enoxaparin (LOVENOX) injection  40 mg Subcutaneous Q24H  . pantoprazole  20 mg Oral BID  . sucralfate  1 g Oral TID WC & HS   Infusions: . sodium chloride 125 mL/hr at 01/14/18 0324  . sodium chloride Stopped (01/14/18 0323)   PRN Meds: alum & mag hydroxide-simeth, EPINEPHrine, morphine injection, nitroGLYCERIN, ondansetron (ZOFRAN) IV, senna-docusate, zolpidem   Allergies as of 01/13/2018 - Review Complete 01/13/2018  Allergen Reaction Noted  . Shellfish allergy Other (See Comments) 09/19/2013  . Ibuprofen Hives 10/19/2014  . Influenza vaccines  10/06/2013  . Nsaids  06/19/2017    Family History  Problem Relation Age of Onset  . Renal Disease Father        on HD  . Heart disease Father         pacemaker  . Diabetes Other        GP late onset  . Colon cancer Neg Hx   . Prostate cancer Neg Hx     Social History   Socioeconomic History  . Marital status: Divorced    Spouse name: Not on file  . Number of children: 4  . Years of education: Not on file  . Highest education level: Not on file  Occupational History  . 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 Financial resource strain: Not on file  . Food insecurity:    Worry: Not on file    Inability: Not on file  . Transportation needs:    Medical: Not on file    Non-medical: Not on file  Tobacco Use  . Smoking status: Never Smoker  . Smokeless tobacco: Never Used  Substance and Sexual Activity  . Alcohol use: Yes    Comment: Occasional  . Drug use: No  . Sexual activity: Not on file  Lifestyle  . Physical activity:    Days per week: Not on file    Minutes per session: Not on file  . Stress: Not on file  Relationships  . Social connections:    Talks on phone: Not on file    Gets together: Not on file    Attends religious service: Not on file    Active member of club or organization: Not on file    Attends meetings of clubs or organizations: Not on file    Relationship status: Not on file  . Intimate partner violence:    Fear of current or ex partner: Not on file    Emotionally abused: Not on file    Physically abused: Not on file    Forced sexual activity: Not on file  Other Topics Concern  . Not on file  Social History Narrative   Lives w/ girl friend    REVIEW OF SYSTEMS: Constitutional: No weakness or dizziness. ENT:  No nose bleeds Pulm: Breathing triggered pain which made him feel short of breath yesterday, this has resolved. CV:  No palpitations, no LE edema.  Anginal pain.  He can ride his mountain bike 10 miles without problem. GU:  No hematuria, no frequency GI:  Per HPI Heme: Denies unusual or excessive bleeding or bruising. Transfusions: None Neuro:  No  headaches, no peripheral tingling or numbness.  No syncope.  Derm:  No itching, no rash or sores.  Endocrine:  No sweats or chills.  No polyuria or dysuria Immunization: Not queried. Travel:  None beyond local counties in last few months.    PHYSICAL EXAM: Vital signs in last 24 hours: Vitals:   01/14/18 0100 01/14/18 0223  BP: 128/84 121/78  Pulse: (!) 104 89  Resp: 20 20  Temp:  97.8 F (36.6 C)  SpO2: 95% 95%   Wt Readings from Last 3 Encounters:  09/06/17 83.9 kg  06/19/17 84.9 kg  04/03/17 85.8 kg    General: Patient looks well.  No distress.  Sitting up in bed comfortable. Head: No facial asymmetry or swelling.  No signs of head trauma. Eyes: No conjunctival pallor.  No icterus.  EOMI. Ears: Not hard of hearing Nose: No congestion or discharge Mouth: Moist, clear, pink oral mucosa.  Tongue midline.  Good dentition. Neck: No JVD, no masses, no thyromegaly. Lungs: Clear bilaterally no labored breathing or cough. Heart: RRR.  No MRG.  S1-S2 present. Abdomen: Soft.  Not tender.  Not distended.  No organomegaly, bruits, masses, hernias..   Rectal: Deferred Musc/Skeltl: No joint redness, swelling or gross deformity. Extremities: No CCE. Neurologic: Fully alert and oriented.  Moves all 4 limbs with full strength.  No tremors.  No gross deficits.  Good historian. Skin: No jaundice, no rashes, no sores. Nodes: No cervical adenopathy. Psych: Pleasant, cooperative, calm.  Intake/Output from previous day: 01/12 0701 - 01/13 0700 In: 700 [IV Piggyback:700] Out: -  Intake/Output this shift: No intake/output data recorded.  LAB RESULTS: Recent Labs    01/13/18 2118 01/14/18 0621  WBC 11.8* 11.7*  HGB 15.7 14.7  HCT 44.4 42.8  PLT 288 239   BMET Lab Results  Component Value Date   NA 139 01/14/2018   NA 135 01/13/2018   NA 143 04/03/2017   K 4.0 01/14/2018   K 2.9 (L) 01/13/2018   K 4.9 04/03/2017   CL 107 01/14/2018   CL 103 01/13/2018   CL 105 04/03/2017     CO2 26 01/14/2018   CO2 20 (L) 01/13/2018   CO2 31 04/03/2017   GLUCOSE 94 01/14/2018   GLUCOSE 134 (H) 01/13/2018   GLUCOSE 95 04/03/2017   BUN 7 01/14/2018   BUN 13 01/13/2018   BUN 20 04/03/2017   CREATININE 0.84 01/14/2018   CREATININE 0.91 01/13/2018   CREATININE 0.87 04/03/2017   CALCIUM 8.7 (L) 01/14/2018   CALCIUM 9.1 01/13/2018   CALCIUM 9.7 04/03/2017   LFT Recent Labs    01/13/18 2118 01/14/18 0621  PROT 6.7 6.0*  ALBUMIN 3.8 3.4*  AST 54* 28  ALT 38 31  ALKPHOS 73 65  BILITOT 0.6 0.8  BILIDIR 0.1  --   IBILI 0.5  --    PT/INR No results found for: INR, PROTIME Hepatitis Panel No results for input(s): HEPBSAG, HCVAB, HEPAIGM, HEPBIGM in the last 72 hours. C-Diff No components found for: CDIFF Lipase     Component Value Date/Time   LIPASE 62 (H) 01/13/2018 2118    Drugs of Abuse     Component Value Date/Time   LABOPIA POSITIVE (A) 01/14/2018 0154   COCAINSCRNUR NONE DETECTED 01/14/2018 0154   LABBENZ NONE DETECTED 01/14/2018 0154   AMPHETMU NONE DETECTED 01/14/2018 0154   THCU NONE DETECTED 01/14/2018 0154   LABBARB NONE DETECTED 01/14/2018 0154     RADIOLOGY STUDIES: Ct Abdomen Pelvis W Contrast  Result Date: 01/13/2018 CLINICAL DATA:  Blunt epigastric pain and indigestion 2 hours ago. Nausea and vomiting. EXAM: CT ABDOMEN AND PELVIS WITH CONTRAST TECHNIQUE: Multidetector CT imaging of the abdomen and pelvis was   performed using the standard protocol following bolus administration of intravenous contrast. CONTRAST:  OMNIPAQUE IOHEXOL 300 MG/ML  SOLN COMPARISON:  Ultrasound abdomen 06/26/2017 FINDINGS: Lower chest: Lung bases are clear. Fluid in the lower esophagus without distention may represent reflux. Hepatobiliary: Cholelithiasis. No other inflammatory changes in the gallbladder. No bile duct dilatation. No focal liver lesions. Pancreas: Unremarkable. No pancreatic ductal dilatation or surrounding inflammatory changes. Spleen: Normal in  size without focal abnormality. Adrenals/Urinary Tract: Adrenal glands are unremarkable. Kidneys are normal, without renal calculi, focal lesion, or hydronephrosis. Bladder is unremarkable. Stomach/Bowel: Stomach is within normal limits. Appendix appears normal. No evidence of bowel wall thickening, distention, or inflammatory changes. Vascular/Lymphatic: No significant vascular findings are present. No enlarged abdominal or pelvic lymph nodes. Reproductive: Prostate gland is enlarged, measuring 5 cm diameter. Other: No free air or free fluid in the abdomen. Abdominal wall musculature appears intact. Musculoskeletal: Degenerative changes in the spine. IMPRESSION: Cholelithiasis. No evidence of bowel obstruction or inflammation. Prostate gland is enlarged. Electronically Signed   By: Burman Nieves M.D.   On: 01/13/2018 23:59   Dg Chest Port 1 View  Result Date: 01/13/2018 CLINICAL DATA:  Chest pain for 1.5 hours.  Nonsmoker. EXAM: PORTABLE CHEST 1 VIEW COMPARISON:  None. FINDINGS: The heart size and mediastinal contours are within normal limits. Both lungs are clear. The visualized skeletal structures are unremarkable. IMPRESSION: No active disease. Electronically Signed   By: Burman Nieves M.D.   On: 01/13/2018 21:50   US Abdomen Limited Ruq  Result Date: 01/14/2018 CLINICAL DATA:  Right upper quadrant abdominal pain, gallstones, elevated LFTs EXAM: ULTRASOUND ABDOMEN LIMITED RIGHT UPPER QUADRANT COMPARISON:  CT abdomen/pelvis dated 01/13/2018 FINDINGS: Gallbladder: Multiple gallstones, measuring up to 12 mm. No gallbladder wall thickening. Negative sonographic Murphy's sign. Common bile duct: Diameter: 5 mm Liver: No focal lesion identified. Within normal limits in parenchymal echogenicity. Portal vein is patent on color Doppler imaging with normal direction of blood flow towards the liver. IMPRESSION: Cholelithiasis, without associated sonographic findings to suggest acute cholecystitis.  Electronically Signed   By: Charline Bills M.D.   On: 01/14/2018 01:32     IMPRESSION:   *   Acute epigastric pain, hx of same. No PPI at home.  Treated for H pylori ~ 2000 and in fall 2019.  Gallstones on CT and ultrasound.  Minor, resolved AST elevation.  Minor lipase elevation but no pancreatitis per imaging.    R/o GERD, PUD vs GB disease vs functional dyspepsia.      PLAN:     *   EGD, hopefully this AM.  Pt NPO.    *   Continue BID Protonix PO for now.        Jennye Moccasin  01/14/2018, 9:17 AM Phone [phone]

## 2018-01-14 NOTE — H&P (Signed)
History and Physical    [name]:[id number] DOB: 01/19/1965 DOA: 01/13/2018  Referring MD/NP/PA:   PCP: [name], MD   Patient coming from:  The patient is coming from home.  At baseline, pt is independent for most of ADL.        Chief Complaint: Abdominal pain, nausea, vomiting, lower chest pain  HPI: Kevin Santos is a 53 y.o. male with medical history significant of hyperlipidemia, gallstone,  GERD, depression, recently treated for H. pylori infection, who presents with abdominal pain, nausea, vomiting and lower chest pain.  Patient states that he has history of gallstone and H. pylori infection.  H. pylori infection was treated 3 months ago by general surgeon, Dr. Lovell Sheehan.  He has been followed up with Dr. Lovell Sheehan for gallstone currently.  Patient states that he has chronic intermittent epigastric abdominal pain, which has been going on for more than 10 years.  He states that he started having epigastric abdominal pain again after he had dinner today.  The pain is constant, 10 out of 10 severity, sharp, nonradiating.  Associated with belching, nausea, and nonbilious and nonbloody vomiting twice.  No diarrhea.  Denies fever or chills.  At the same time he has lower chest pain. He is not sure if chest pain is radiated from epigastric abdominal pain.  No shortness of breath and cough.  Denies symptoms of UTI or unilateral weakness.  He stopped taking Lipitor for hyperlipidemia since he managed to have lost weight recently. He states that he drinks 1 to 2 glasses of wine, approximately 3 times a week.  Never had withdrawal problem when stopped drinking.  He states that he took 1500 g of Tylenol for abdominal pain today.  He is not taking aspirin or NSAIDs.  ED Course: pt was found to have WBC 11.8, negative troponin, lipase 62, potassium 2.9, creatinine and BUN normal, d-dimer negative, temperature normal, no tachycardia, oxygen saturation 98% on room air.  Chest x-ray negative.  CT  abdomen/pelvis showed gallstones without evidence of cholecystitis or ductal dilation. Pt is placed on telemetry bed for observation.  Review of Systems:   General: no fevers, chills, has poor appetite, has fatigue HEENT: no blurry vision, hearing changes or sore throat Respiratory: no dyspnea, coughing, wheezing CV: has lower chest pain, no palpitations GI: has nausea, vomiting, abdominal pain, no diarrhea, constipation GU: no dysuria, burning on urination, increased urinary frequency, hematuria  Ext: no leg edema Neuro: no unilateral weakness, numbness, or tingling, no vision change or hearing loss Skin: no rash, no skin tear. MSK: No muscle spasm, no deformity, no limitation of range of movement in spin Heme: No easy bruising.  Travel history: No recent long distant travel.  Allergy:  Allergies  Allergen Reactions  . Shellfish Allergy Other (See Comments)    Lip and mouth swelling  . Ibuprofen Hives  . Influenza Vaccines     Rash-neck-face, lip swelling  . Nsaids     Past Medical History:  Diagnosis Date  . Allergic rhinitis 10/06/2013  . Dyspepsia 2013   treated for H pylori, no EGD, Dr Norma Fredrickson  . Gallstone   . GERD (gastroesophageal reflux disease)   . Hyperlipidemia   . Shellfish allergy     Past Surgical History:  Procedure Laterality Date  . SEPTOPLASTY    . WRIST FRACTURE SURGERY      Social History:  reports that he has never smoked. He has never used smokeless tobacco. He reports current alcohol use.  He reports that he does not use drugs.  Family History:  Family History  Problem Relation Age of Onset  . Renal Disease Father        on HD  . Heart disease Father        pacemaker  . Diabetes Other        GP late onset  . Colon cancer Neg Hx   . Prostate cancer Neg Hx      Prior to Admission medications   Medication Sig Start Date End Date Taking? Authorizing Provider  EPINEPHrine 0.3 mg/0.3 mL IJ SOAJ injection Inject 0.3 mLs (0.3 mg total) into  the muscle once. 03/18/14  Yes Paz, Nolon RodJose E, MD  lansoprazole (PREVACID) 30 MG capsule Take 1 capsule (30 mg total) by mouth daily at 12 noon. Patient not taking: Reported on 01/14/2018 09/06/17   Franky MachoJenkins, Mark, MD  rosuvastatin (CRESTOR) 10 MG tablet Take 1 tablet (10 mg total) by mouth daily. Patient not taking: Reported on 01/14/2018 04/04/17   Wanda PlumpPaz, Jose E, MD    Physical Exam: Vitals:   01/14/18 0015 01/14/18 0030 01/14/18 0045 01/14/18 0100  BP: 130/86 125/86 126/88 128/84  Pulse: 90 98 (!) 110 (!) 104  Resp: 18 20 16 20   Temp:      TempSrc:      SpO2: 98% 97% 97% 95%   General: Not in acute distress.  Dry mucous and membrane HEENT:       Eyes: PERRL, EOMI, no scleral icterus.       ENT: No discharge from the ears and nose, no pharynx injection, no tonsillar enlargement.        Neck: No JVD, no bruit, no mass felt. Heme: No neck lymph node enlargement. Cardiac: S1/S2, RRR, No murmurs, No gallops or rubs. Respiratory: No rales, wheezing, rhonchi or rubs. GI: Soft, nondistended, has tenderness in epigastric area, no rebound pain, no organomegaly, BS present. GU: No hematuria Ext: No pitting leg edema bilaterally. 2+DP/PT pulse bilaterally. Musculoskeletal: No joint deformities, No joint redness or warmth, no limitation of ROM in spin. Skin: No rashes.  Neuro: Alert, oriented X3, cranial nerves II-XII grossly intact, moves all extremities normally. Psych: Patient is not psychotic, no suicidal or hemocidal ideation.  Labs on Admission: I have personally reviewed following labs and imaging studies  CBC: Recent Labs  Lab 01/13/18 2118  WBC 11.8*  HGB 15.7  HCT 44.4  MCV 89.3  PLT 288   Basic Metabolic Panel: Recent Labs  Lab 01/13/18 2118  NA 135  K 2.9*  CL 103  CO2 20*  GLUCOSE 134*  BUN 13  CREATININE 0.91  CALCIUM 9.1   GFR: CrCl cannot be calculated (Unknown ideal weight.). Liver Function Tests: Recent Labs  Lab 01/13/18 2118  AST 54*  ALT 38  ALKPHOS 73    BILITOT 0.6  PROT 6.7  ALBUMIN 3.8   Recent Labs  Lab 01/13/18 2118  LIPASE 62*   No results for input(s): AMMONIA in the last 168 hours. Coagulation Profile: No results for input(s): INR, PROTIME in the last 168 hours. Cardiac Enzymes: No results for input(s): CKTOTAL, CKMB, CKMBINDEX, TROPONINI in the last 168 hours. BNP (last 3 results) No results for input(s): PROBNP in the last 8760 hours. HbA1C: No results for input(s): HGBA1C in the last 72 hours. CBG: No results for input(s): GLUCAP in the last 168 hours. Lipid Profile: No results for input(s): CHOL, HDL, LDLCALC, TRIG, CHOLHDL, LDLDIRECT in the last 72 hours. Thyroid Function Tests:  No results for input(s): TSH, T4TOTAL, FREET4, T3FREE, THYROIDAB in the last 72 hours. Anemia Panel: No results for input(s): VITAMINB12, FOLATE, FERRITIN, TIBC, IRON, RETICCTPCT in the last 72 hours. Urine analysis: No results found for: COLORURINE, APPEARANCEUR, LABSPEC, PHURINE, GLUCOSEU, HGBUR, BILIRUBINUR, KETONESUR, PROTEINUR, UROBILINOGEN, NITRITE, LEUKOCYTESUR Sepsis Labs: @LABRCNTIP (procalcitonin:4,lacticidven:4) )No results found for this or any previous visit (from the past 240 hour(s)).   Radiological Exams on Admission: Ct Abdomen Pelvis W Contrast  Result Date: 01/13/2018 CLINICAL DATA:  Blunt epigastric pain and indigestion 2 hours ago. Nausea and vomiting. EXAM: CT ABDOMEN AND PELVIS WITH CONTRAST TECHNIQUE: Multidetector CT imaging of the abdomen and pelvis was performed using the standard protocol following bolus administration of intravenous contrast. CONTRAST:  OMNIPAQUE IOHEXOL 300 MG/ML  SOLN COMPARISON:  Ultrasound abdomen 06/26/2017 FINDINGS: Lower chest: Lung bases are clear. Fluid in the lower esophagus without distention may represent reflux. Hepatobiliary: Cholelithiasis. No other inflammatory changes in the gallbladder. No bile duct dilatation. No focal liver lesions. Pancreas: Unremarkable. No pancreatic  ductal dilatation or surrounding inflammatory changes. Spleen: Normal in size without focal abnormality. Adrenals/Urinary Tract: Adrenal glands are unremarkable. Kidneys are normal, without renal calculi, focal lesion, or hydronephrosis. Bladder is unremarkable. Stomach/Bowel: Stomach is within normal limits. Appendix appears normal. No evidence of bowel wall thickening, distention, or inflammatory changes. Vascular/Lymphatic: No significant vascular findings are present. No enlarged abdominal or pelvic lymph nodes. Reproductive: Prostate gland is enlarged, measuring 5 cm diameter. Other: No free air or free fluid in the abdomen. Abdominal wall musculature appears intact. Musculoskeletal: Degenerative changes in the spine. IMPRESSION: Cholelithiasis. No evidence of bowel obstruction or inflammation. Prostate gland is enlarged. Electronically Signed   By: Burman Nieves M.D.   On: 01/13/2018 23:59   Dg Chest Port 1 View  Result Date: 01/13/2018 CLINICAL DATA:  Chest pain for 1.5 hours.  Nonsmoker. EXAM: PORTABLE CHEST 1 VIEW COMPARISON:  None. FINDINGS: The heart size and mediastinal contours are within normal limits. Both lungs are clear. The visualized skeletal structures are unremarkable. IMPRESSION: No active disease. Electronically Signed   By: Burman Nieves M.D.   On: 01/13/2018 21:50     EKG: Independently reviewed.  Sinus rhythm, QTC 445, early R wave progression, Q waves in the inferior leads.   Assessment/Plan Principal Problem:   Abdominal pain Active Problems:   Hyperlipidemia   GERD (gastroesophageal reflux disease)   Abnormal LFTs   Hypokalemia   Atypical chest pain   Abdominal pain: Patient has chronic epigastric abdominal pain which is worsened today.  Etiology is not clear.  Differential diagnosis include peptic ulcer disease, gastritis and cholecystitis.  Patient has mildly elevated AST, otherwise liver function normal.  CT scan showed gallstone, but no evidence of  cholecystitis.  He has mild leukocytosis with WBC 11.8, but no fever.  -will place on tele bed for obs -Protonix 40 mg twice daily, PRN Mylanta -Carafate -Patient received 1 dose of Pepcid in ED -US-RUQ -PRN morphine for pain, Zofran for nausea -IV fluid: 1.5 normal saline, followed by 25 cc/h  Hypokalemia: K= 2.9  on admission. - Repleted - Check Mg level - Give 1 g of magnesium sulfate  Atypical chest pain: Patient has atypical lower chest pain, possibly from radiation of epigastric abdominal pain.  Since patient has hyperlipidemia with LDL 155 on 03/02/2016, and significant family history (father died of MI at age of 4), will trend troponin. -PRN morphine and nitroglycerin for chest pain -Will not start aspirin due to suspicion of peptic  ulcer disease -A1c, FLP, UDS -trop x 3 and repeat EKG  Hyperlipidemia: not taking meds -f/u FLP  GERD (gastroesophageal reflux disease): -on protonix  Abnormal LFTs:  Mild elevation of AST 54 -f/u US-RUQ -check tylenol lelve   DVT ppx: SQ Lovenox Code Status: Full code Family Communication:   Yes, patient's wife at bed side Disposition Plan:  Anticipate discharge back to previous home environment Consults called:  none Admission status: Obs / tele       Date of Service 01/14/2018    Lorretta HarpXilin Lillee Mooneyhan [facility] Pager [phone]  If 7PM-7AM, please contact night-coverage [email or web address] Password [email or web address] 01/14/2018, 1:32 AM

## 2018-01-14 NOTE — Discharge Summary (Signed)
Physician Discharge Summary  Kevin RothmanRichard J Bame ZOX:[id number] DOB: 1965-07-14 DOA: 01/13/2018  PCP: Wanda PlumpPaz, Jose E, MD  Admit date: 01/13/2018 Discharge date: 01/14/2018  Admitted From: home Discharge disposition: home   Recommendations for Outpatient Follow-Up:   1. Outpatient follow up with Dr. Lovell SheehanJenkins for consideration of GB removal   Discharge Diagnosis:   Principal Problem:   Abdominal pain Active Problems:   Hyperlipidemia   GERD (gastroesophageal reflux disease)   Abnormal LFTs   Hypokalemia   Atypical chest pain    Discharge Condition: Improved.  Diet recommendation: low fat  Wound care: None.  Code status: Full.   History of Present Illness:   Kevin RothmanRichard J Santos is a 53 y.o. male with medical history significant of hyperlipidemia, gallstone,  GERD, depression, recently treated for H. pylori infection, who presents with abdominal pain, nausea, vomiting and lower chest pain.  Patient states that he has history of gallstone and H. pylori infection.  H. pylori infection was treated 3 months ago by general surgeon, Dr. Lovell SheehanJenkins.  He has been followed up with Dr. Lovell SheehanJenkins for gallstone currently.  Patient states that he has chronic intermittent epigastric abdominal pain, which has been going on for more than 10 years.  He states that he started having epigastric abdominal pain again after he had dinner today.  The pain is constant, 10 out of 10 severity, sharp, nonradiating.  Associated with belching, nausea, and nonbilious and nonbloody vomiting twice.  No diarrhea.  Denies fever or chills.  At the same time he has lower chest pain. He is not sure if chest pain is radiated from epigastric abdominal pain.  No shortness of breath and cough.  Denies symptoms of UTI or unilateral weakness.  He stopped taking Lipitor for hyperlipidemia since he managed to have lost weight recently. He states that he drinks 1 to 2 glasses of wine, approximately 3 times a week.  Never had  withdrawal problem when stopped drinking.  He states that he took 1500 g of Tylenol for abdominal pain today.  He is not taking aspirin or NSAIDs.   Hospital Course by Problem:   Abdominal pain:  -s/p EGD: Overall, inflammatory changes in the distal  esophagus are mild, and while this could cause epigastric pain, given the severity of his symptoms  I suspect biliary colic from gallstones is the more  likely cause. -patient to follow up with Dr. Lovell SheehanJenkins  Hypokalemia:  -repleted  Atypical chest pain:  -trop x 3 and EKG negative  Hyperlipidemia: not taking meds -outpatient follow up  GERD (gastroesophageal reflux disease): -PPI  Abnormal LFTs:  Mild elevation of AST 54 -due to GB    Medical Consultants:  GI- [facility]    Discharge Exam:   Vitals:   01/14/18 1140 01/14/18 1158  BP: 127/71 104/64  Pulse:  78  Resp: (!) 23 18  Temp:  98.9 F (37.2 C)  SpO2: 98% 97%   Vitals:   01/14/18 1128 01/14/18 1130 01/14/18 1140 01/14/18 1158  BP: (!) 99/54 (!) 99/54 127/71 104/64  Pulse:  86  78  Resp: 19 19 (!) 23 18  Temp: 98 F (36.7 C)   98.9 F (37.2 C)  TempSrc: Oral   Oral  SpO2: 98% 96% 98% 97%  Weight:      Height:        General exam: Appears calm and comfortable.  The results of significant diagnostics from this hospitalization (including imaging, microbiology, ancillary and laboratory) are listed below  for reference.     Procedures and Diagnostic Studies:   Ct Abdomen Pelvis W Contrast  Result Date: 01/13/2018 CLINICAL DATA:  Blunt epigastric pain and indigestion 2 hours ago. Nausea and vomiting. EXAM: CT ABDOMEN AND PELVIS WITH CONTRAST TECHNIQUE: Multidetector CT imaging of the abdomen and pelvis was performed using the standard protocol following bolus administration of intravenous contrast. CONTRAST:  100mL OMNIPAQUE IOHEXOL 300 MG/ML  SOLN COMPARISON:  Ultrasound abdomen 06/26/2017 FINDINGS: Lower chest: Lung bases are clear. Fluid in the lower  esophagus without distention may represent reflux. Hepatobiliary: Cholelithiasis. No other inflammatory changes in the gallbladder. No bile duct dilatation. No focal liver lesions. Pancreas: Unremarkable. No pancreatic ductal dilatation or surrounding inflammatory changes. Spleen: Normal in size without focal abnormality. Adrenals/Urinary Tract: Adrenal glands are unremarkable. Kidneys are normal, without renal calculi, focal lesion, or hydronephrosis. Bladder is unremarkable. Stomach/Bowel: Stomach is within normal limits. Appendix appears normal. No evidence of bowel wall thickening, distention, or inflammatory changes. Vascular/Lymphatic: No significant vascular findings are present. No enlarged abdominal or pelvic lymph nodes. Reproductive: Prostate gland is enlarged, measuring 5 cm diameter. Other: No free air or free fluid in the abdomen. Abdominal wall musculature appears intact. Musculoskeletal: Degenerative changes in the spine. IMPRESSION: Cholelithiasis. No evidence of bowel obstruction or inflammation. Prostate gland is enlarged. Electronically Signed   By: Burman NievesWilliam  Stevens M.D.   On: 01/13/2018 23:59   Dg Chest Port 1 View  Result Date: 01/13/2018 CLINICAL DATA:  Chest pain for 1.5 hours.  Nonsmoker. EXAM: PORTABLE CHEST 1 VIEW COMPARISON:  None. FINDINGS: The heart size and mediastinal contours are within normal limits. Both lungs are clear. The visualized skeletal structures are unremarkable. IMPRESSION: No active disease. Electronically Signed   By: Burman NievesWilliam  Stevens M.D.   On: 01/13/2018 21:50   [place] Abdomen Limited Ruq  Result Date: 01/14/2018 CLINICAL DATA:  Right upper quadrant abdominal pain, gallstones, elevated LFTs EXAM: ULTRASOUND ABDOMEN LIMITED RIGHT UPPER QUADRANT COMPARISON:  CT abdomen/pelvis dated 01/13/2018 FINDINGS: Gallbladder: Multiple gallstones, measuring up to 12 mm. No gallbladder wall thickening. Negative sonographic Murphy's sign. Common bile duct: Diameter: 5 mm Liver:  No focal lesion identified. Within normal limits in parenchymal echogenicity. Portal vein is patent on color Doppler imaging with normal direction of blood flow towards the liver. IMPRESSION: Cholelithiasis, without associated sonographic findings to suggest acute cholecystitis. Electronically Signed   By: Charline BillsSriyesh  Krishnan M.D.   On: 01/14/2018 01:32     Labs:   Basic Metabolic Panel: Recent Labs  Lab 01/13/18 2118 01/14/18 0148 01/14/18 0621  NA 135  --  139  K 2.9*  --  4.0  CL 103  --  107  CO2 20*  --  26  GLUCOSE 134*  --  94  BUN 13  --  7  CREATININE 0.91  --  0.84  CALCIUM 9.1  --  8.7*  MG  --  2.0  --    GFR Estimated Creatinine Clearance: 100.7 mL/min (by C-G formula based on SCr of 0.84 mg/dL). Liver Function Tests: Recent Labs  Lab 01/13/18 2118 01/14/18 0621  AST 54* 28  ALT 38 31  ALKPHOS 73 65  BILITOT 0.6 0.8  PROT 6.7 6.0*  ALBUMIN 3.8 3.4*   Recent Labs  Lab 01/13/18 2118  LIPASE 62*   No results for input(s): AMMONIA in the last 168 hours. Coagulation profile No results for input(s): INR, PROTIME in the last 168 hours.  CBC: Recent Labs  Lab 01/13/18 2118 01/14/18 [id number]  WBC 11.8* 11.7*  HGB 15.7 14.7  HCT 44.4 42.8  MCV 89.3 90.9  PLT 288 239   Cardiac Enzymes: Recent Labs  Lab 01/14/18 0104 01/14/18 0148 01/14/18 0621  TROPONINI <0.03 <0.03 <0.03   BNP: Invalid input(s): POCBNP CBG: No results for input(s): GLUCAP in the last 168 hours. D-Dimer Recent Labs    01/13/18 2118  DDIMER <0.27   Hgb A1c Recent Labs    01/14/18 0154  HGBA1C 5.0   Lipid Profile Recent Labs    01/14/18 0454  CHOL 197  HDL 42  LDLCALC 141*  TRIG 69  CHOLHDL 4.7   Thyroid function studies No results for input(s): TSH, T4TOTAL, T3FREE, THYROIDAB in the last 72 hours.  Invalid input(s): FREET3 Anemia work up No results for input(s): VITAMINB12, FOLATE, FERRITIN, TIBC, IRON, RETICCTPCT in the last 72 hours. Microbiology No results  found for this or any previous visit (from the past 240 hour(s)).   Discharge Instructions:   Discharge Instructions    Discharge instructions   Complete by:  As directed    Bland/low fat diet Schedule appointment with Dr. Lovell Sheehan   Increase activity slowly   Complete by:  As directed      Allergies as of 01/14/2018      Reactions   Shellfish Allergy Other (See Comments)   Lip and mouth swelling   Ibuprofen Hives   Influenza Vaccines    Rash-neck-face, lip swelling   Nsaids       Medication List    STOP taking these medications   lansoprazole 30 MG capsule Commonly known as:  PREVACID   rosuvastatin 10 MG tablet Commonly known as:  CRESTOR     TAKE these medications   EPINEPHrine 0.3 mg/0.3 mL Soaj injection Commonly known as:  EPI-PEN Inject 0.3 mLs (0.3 mg total) into the muscle once.   omeprazole 40 MG capsule Commonly known as:  PRILOSEC Take 1 capsule (40 mg total) by mouth daily.      Follow-up Information    [name], MD Follow up in 1 week(s).   Specialty:  Internal Medicine Contact information: [address] [place] [address] [phone]        Franky Macho, MD Follow up.   Specialty:  General Surgery Contact information: [address] [place] [address] [phone]            Time coordinating discharge: 25 min  Signed:  Joseph Art DO  [facility] 01/14/2018, 12:56 PM

## 2018-01-14 NOTE — Anesthesia Procedure Notes (Signed)
Procedure Name: MAC Date/Time: 01/14/2018 11:13 AM Performed by: Orlie Dakin, CRNA Pre-anesthesia Checklist: Patient identified, Emergency Drugs available, Suction available and Patient being monitored Patient Re-evaluated:Patient Re-evaluated prior to induction Oxygen Delivery Method: Nasal cannula Preoxygenation: Pre-oxygenation with 100% oxygen Induction Type: IV induction

## 2018-01-14 NOTE — H&P (View-Only) (Signed)
[facility] Consult: 9:17 AM 01/14/2018  LOS: 0 days    Referring Provider: Dr Verner MouldJ Vann  Primary Care Physician:  Wanda PlumpPaz, Jose E, MD Primary Gastroenterologist:  Dr. Christella HartiganJacobs     Reason for Consultation:  Epigastric pain   HPI: Kevin Santos is a 53 y.o. male.  Generally healthy man. Long hx indigestion, epigastric discomfort.  + serum H Pylori treated 20 years ago and again in October 2019.   Seen by Dr Christella HartiganJacobs 06/2017.  Though suggested, EGD/screening colonoscopy never scheduled.  Ended up undergoing Cologuard testing which was negative in 09/2017.  Pt not taking PPI as prescribed in past Has episodes of a feeling of fullness which proceeds the acute epigastric pain.  Rarely does he get nausea or vomiting.  He can usually relieve the symptoms with p.o. Tylenol, sitting upright, walking.  Gas-X, antacids, acid suppressing medications do not tend to help.  Previously episodes seem to be related to specific foods, especially fatty foods.  However in recent months, when he has had episodes there was no particular culprit food. Yesterday had a particularly acute episode which initially felt better after Tylenol but recurred.  It had started after eating fried rice which normally does not cause problems.  He took more Tylenol but the pain persisted.  + 5 pheresis.  Pain was triggered by taking a breath.  In the ED morphine did not help.  He received fentanyl and a GI cocktail and it about 15 minutes later the symptoms subsided.  They have recurred slightly but are not severe.  Yesterday he did vomit, nonbloody, partially digested food.  Patient drinks 1 to 2 glasses 3 times a week.  He has been exercising and eating healthy/dieting and has managed to lose 15 pounds and lower his cholesterol to the point he does not need  anticholesterol medications.  LFTs: other than AST 54, were/are normal, Lipase 62 (rr 11 - 51)  WBCs to 11.8.  Troponins negative.    CT ab/pelvis with contrast: cholelithiasis, prostate enlarged.      Abd ultrasound: multiple gallstones, no sonographic findings to suggest acute cholecystitis.     Past Medical History:  Diagnosis Date  . Allergic rhinitis 10/06/2013  . Dyspepsia 2013   treated for H pylori, no EGD, Dr Norma Fredricksonoledo  . Gallstone   . GERD (gastroesophageal reflux disease)   . Hyperlipidemia   . Shellfish allergy     Past Surgical History:  Procedure Laterality Date  . SEPTOPLASTY    . WRIST FRACTURE SURGERY      Prior to Admission medications   Medication Sig Start Date End Date Taking? Authorizing Provider  EPINEPHrine 0.3 mg/0.3 mL IJ SOAJ injection Inject 0.3 mLs (0.3 mg total) into the muscle once. 03/18/14  Yes Paz, Nolon RodJose E, MD  lansoprazole (PREVACID) 30 MG capsule Take 1 capsule (30 mg total) by mouth daily at 12 noon. Patient not taking: Reported on 01/14/2018 09/06/17   Franky MachoJenkins, Mark, MD  rosuvastatin (CRESTOR) 10 MG tablet Take 1 tablet (10 mg total) by mouth daily. Patient not taking:  Reported on 01/14/2018 04/04/17   [name], MD    Scheduled Meds: . enoxaparin (LOVENOX) injection  40 mg Subcutaneous Q24H  . pantoprazole  20 mg Oral BID  . sucralfate  1 g Oral TID WC & HS   Infusions: . sodium chloride 125 mL/hr at 01/14/18 0324  . sodium chloride Stopped (01/14/18 0323)   PRN Meds: alum & mag hydroxide-simeth, EPINEPHrine, morphine injection, nitroGLYCERIN, ondansetron (ZOFRAN) IV, senna-docusate, zolpidem   Allergies as of 01/13/2018 - Review Complete 01/13/2018  Allergen Reaction Noted  . Shellfish allergy Other (See Comments) 09/19/2013  . Ibuprofen Hives 10/19/2014  . Influenza vaccines  10/06/2013  . Nsaids  06/19/2017    Family History  Problem Relation Age of Onset  . Renal Disease Father        on HD  . Heart disease Father         pacemaker  . Diabetes Other        GP late onset  . Colon cancer Neg Hx   . Prostate cancer Neg Hx     Social History   Socioeconomic History  . Marital status: Divorced    Spouse name: Not on file  . Number of children: 4  . Years of education: Not on file  . Highest education level: Not on file  Occupational History  . 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 Financial resource strain: Not on file  . Food insecurity:    Worry: Not on file    Inability: Not on file  . Transportation needs:    Medical: Not on file    Non-medical: Not on file  Tobacco Use  . Smoking status: Never Smoker  . Smokeless tobacco: Never Used  Substance and Sexual Activity  . Alcohol use: Yes    Comment: Occasional  . Drug use: No  . Sexual activity: Not on file  Lifestyle  . Physical activity:    Days per week: Not on file    Minutes per session: Not on file  . Stress: Not on file  Relationships  . Social connections:    Talks on phone: Not on file    Gets together: Not on file    Attends religious service: Not on file    Active member of club or organization: Not on file    Attends meetings of clubs or organizations: Not on file    Relationship status: Not on file  . Intimate partner violence:    Fear of current or ex partner: Not on file    Emotionally abused: Not on file    Physically abused: Not on file    Forced sexual activity: Not on file  Other Topics Concern  . Not on file  Social History Narrative   Lives w/ girl friend    REVIEW OF SYSTEMS: Constitutional: No weakness or dizziness. ENT:  No nose bleeds Pulm: Breathing triggered pain which made him feel short of breath yesterday, this has resolved. CV:  No palpitations, no LE edema.  Anginal pain.  He can ride his mountain bike 10 miles without problem. GU:  No hematuria, no frequency GI:  Per HPI Heme: Denies unusual or excessive bleeding or bruising. Transfusions: None Neuro:  No  headaches, no peripheral tingling or numbness.  No syncope.  Derm:  No itching, no rash or sores.  Endocrine:  No sweats or chills.  No polyuria or dysuria Immunization: Not queried. Travel:  None beyond local counties in last few months.    PHYSICAL EXAM: Vital signs in last 24 hours: Vitals:   01/14/18 0100 01/14/18 0223  BP: 128/84 121/78  Pulse: (!) 104 89  Resp: 20 20  Temp:  97.8 F (36.6 C)  SpO2: 95% 95%   Wt Readings from Last 3 Encounters:  09/06/17 83.9 kg  06/19/17 84.9 kg  04/03/17 85.8 kg    General: Patient looks well.  No distress.  Sitting up in bed comfortable. Head: No facial asymmetry or swelling.  No signs of head trauma. Eyes: No conjunctival pallor.  No icterus.  EOMI. Ears: Not hard of hearing Nose: No congestion or discharge Mouth: Moist, clear, pink oral mucosa.  Tongue midline.  Good dentition. Neck: No JVD, no masses, no thyromegaly. Lungs: Clear bilaterally no labored breathing or cough. Heart: RRR.  No MRG.  S1-S2 present. Abdomen: Soft.  Not tender.  Not distended.  No organomegaly, bruits, masses, hernias..   Rectal: Deferred Musc/Skeltl: No joint redness, swelling or gross deformity. Extremities: No CCE. Neurologic: Fully alert and oriented.  Moves all 4 limbs with full strength.  No tremors.  No gross deficits.  Good historian. Skin: No jaundice, no rashes, no sores. Nodes: No cervical adenopathy. Psych: Pleasant, cooperative, calm.  Intake/Output from previous day: 01/12 0701 - 01/13 0700 In: 700 [IV Piggyback:700] Out: -  Intake/Output this shift: No intake/output data recorded.  LAB RESULTS: Recent Labs    01/13/18 2118 01/14/18 0621  WBC 11.8* 11.7*  HGB 15.7 14.7  HCT 44.4 42.8  PLT 288 239   BMET Lab Results  Component Value Date   NA 139 01/14/2018   NA 135 01/13/2018   NA 143 04/03/2017   K 4.0 01/14/2018   K 2.9 (L) 01/13/2018   K 4.9 04/03/2017   CL 107 01/14/2018   CL 103 01/13/2018   CL 105 04/03/2017     CO2 26 01/14/2018   CO2 20 (L) 01/13/2018   CO2 31 04/03/2017   GLUCOSE 94 01/14/2018   GLUCOSE 134 (H) 01/13/2018   GLUCOSE 95 04/03/2017   BUN 7 01/14/2018   BUN 13 01/13/2018   BUN 20 04/03/2017   CREATININE 0.84 01/14/2018   CREATININE 0.91 01/13/2018   CREATININE 0.87 04/03/2017   CALCIUM 8.7 (L) 01/14/2018   CALCIUM 9.1 01/13/2018   CALCIUM 9.7 04/03/2017   LFT Recent Labs    01/13/18 2118 01/14/18 0621  PROT 6.7 6.0*  ALBUMIN 3.8 3.4*  AST 54* 28  ALT 38 31  ALKPHOS 73 65  BILITOT 0.6 0.8  BILIDIR 0.1  --   IBILI 0.5  --    PT/INR No results found for: INR, PROTIME Hepatitis Panel No results for input(s): HEPBSAG, HCVAB, HEPAIGM, HEPBIGM in the last 72 hours. C-Diff No components found for: CDIFF Lipase     Component Value Date/Time   LIPASE 62 (H) 01/13/2018 2118    Drugs of Abuse     Component Value Date/Time   LABOPIA POSITIVE (A) 01/14/2018 0154   COCAINSCRNUR NONE DETECTED 01/14/2018 0154   LABBENZ NONE DETECTED 01/14/2018 0154   AMPHETMU NONE DETECTED 01/14/2018 0154   THCU NONE DETECTED 01/14/2018 0154   LABBARB NONE DETECTED 01/14/2018 0154     RADIOLOGY STUDIES: Ct Abdomen Pelvis W Contrast  Result Date: 01/13/2018 CLINICAL DATA:  Blunt epigastric pain and indigestion 2 hours ago. Nausea and vomiting. EXAM: CT ABDOMEN AND PELVIS WITH CONTRAST TECHNIQUE: Multidetector CT imaging of the abdomen and pelvis was  performed using the standard protocol following bolus administration of intravenous contrast. CONTRAST:  OMNIPAQUE IOHEXOL 300 MG/ML  SOLN COMPARISON:  Ultrasound abdomen 06/26/2017 FINDINGS: Lower chest: Lung bases are clear. Fluid in the lower esophagus without distention may represent reflux. Hepatobiliary: Cholelithiasis. No other inflammatory changes in the gallbladder. No bile duct dilatation. No focal liver lesions. Pancreas: Unremarkable. No pancreatic ductal dilatation or surrounding inflammatory changes. Spleen: Normal in  size without focal abnormality. Adrenals/Urinary Tract: Adrenal glands are unremarkable. Kidneys are normal, without renal calculi, focal lesion, or hydronephrosis. Bladder is unremarkable. Stomach/Bowel: Stomach is within normal limits. Appendix appears normal. No evidence of bowel wall thickening, distention, or inflammatory changes. Vascular/Lymphatic: No significant vascular findings are present. No enlarged abdominal or pelvic lymph nodes. Reproductive: Prostate gland is enlarged, measuring 5 cm diameter. Other: No free air or free fluid in the abdomen. Abdominal wall musculature appears intact. Musculoskeletal: Degenerative changes in the spine. IMPRESSION: Cholelithiasis. No evidence of bowel obstruction or inflammation. Prostate gland is enlarged. Electronically Signed   By: Burman Nieves M.D.   On: 01/13/2018 23:59   Dg Chest Port 1 View  Result Date: 01/13/2018 CLINICAL DATA:  Chest pain for 1.5 hours.  Nonsmoker. EXAM: PORTABLE CHEST 1 VIEW COMPARISON:  None. FINDINGS: The heart size and mediastinal contours are within normal limits. Both lungs are clear. The visualized skeletal structures are unremarkable. IMPRESSION: No active disease. Electronically Signed   By: Burman Nieves M.D.   On: 01/13/2018 21:50   US Abdomen Limited Ruq  Result Date: 01/14/2018 CLINICAL DATA:  Right upper quadrant abdominal pain, gallstones, elevated LFTs EXAM: ULTRASOUND ABDOMEN LIMITED RIGHT UPPER QUADRANT COMPARISON:  CT abdomen/pelvis dated 01/13/2018 FINDINGS: Gallbladder: Multiple gallstones, measuring up to 12 mm. No gallbladder wall thickening. Negative sonographic Murphy's sign. Common bile duct: Diameter: 5 mm Liver: No focal lesion identified. Within normal limits in parenchymal echogenicity. Portal vein is patent on color Doppler imaging with normal direction of blood flow towards the liver. IMPRESSION: Cholelithiasis, without associated sonographic findings to suggest acute cholecystitis.  Electronically Signed   By: Charline Bills M.D.   On: 01/14/2018 01:32     IMPRESSION:   *   Acute epigastric pain, hx of same. No PPI at home.  Treated for H pylori ~ 2000 and in fall 2019.  Gallstones on CT and ultrasound.  Minor, resolved AST elevation.  Minor lipase elevation but no pancreatitis per imaging.    R/o GERD, PUD vs GB disease vs functional dyspepsia.      PLAN:     *   EGD, hopefully this AM.  Pt NPO.    *   Continue BID Protonix PO for now.        Jennye Moccasin  01/14/2018, 9:17 AM Phone [phone]

## 2018-01-14 NOTE — ED Notes (Signed)
Pt in US

## 2018-01-14 NOTE — Interval H&P Note (Signed)
History and Physical Interval Note:  01/14/2018 11:10 AM  Kevin Santos  has presented today for surgery, with the diagnosis of epigastric pain, N/V  The various methods of treatment have been discussed with the patient and family. After consideration of risks, benefits and other options for treatment, the patient has consented to  Procedure(s): ESOPHAGOGASTRODUODENOSCOPY (EGD) WITH PROPOFOL (N/A) as a surgical intervention .  The patient's history has been reviewed, patient examined, no change in status, stable for surgery.  I have reviewed the patient's chart and labs.  Questions were answered to the patient's satisfaction.     Viviann Spare P [facility]

## 2018-01-15 ENCOUNTER — Encounter (HOSPITAL_COMMUNITY): Payer: Self-pay | Admitting: Gastroenterology

## 2018-01-15 LAB — HEPATITIS PANEL, ACUTE
HCV Ab: <0.1 s/co ratio (ref 0.0–0.9)
Hep A IgM: NEGATIVE
Hep B C IgM: NEGATIVE
Hepatitis B Surface Ag: NEGATIVE

## 2018-01-16 ENCOUNTER — Other Ambulatory Visit: Payer: Self-pay

## 2018-01-16 DIAGNOSIS — A048 Other specified bacterial intestinal infections: Secondary | ICD-10-CM

## 2018-01-16 MED ORDER — OMEPRAZOLE 40 MG PO CPDR: 40.0000 mg | DELAYED_RELEASE_CAPSULE | Freq: Two times a day (BID) | ORAL | 0 refills | Status: AC

## 2018-01-16 MED ORDER — AMOXICILLIN 500 MG PO TABS: 1000.0000 mg | ORAL_TABLET | Freq: Two times a day (BID) | ORAL | 0 refills | Status: DC

## 2018-01-16 MED ORDER — METRONIDAZOLE 500 MG PO TABS: 500.0000 mg | ORAL_TABLET | Freq: Two times a day (BID) | ORAL | 0 refills | Status: AC

## 2018-01-16 MED ORDER — CLARITHROMYCIN 500 MG PO TABS: 500.0000 mg | ORAL_TABLET | Freq: Two times a day (BID) | ORAL | 0 refills | Status: AC

## 2018-01-16 MED FILL — metroNIDAZOLE 500 MG TABS: 500 | 14 days supply | Qty: 28 | Fill #0

## 2018-01-16 MED FILL — CLARITHROMYCIN 500 MG TAB: 500 | 14 days supply | Qty: 28 | Fill #0

## 2018-01-16 MED FILL — AMOXICILLIN 500 MG CAPSULE: 500 | 14 days supply | Qty: 56 | Fill #0

## 2018-01-17 ENCOUNTER — Encounter (HOSPITAL_COMMUNITY): Payer: Self-pay

## 2018-01-17 ENCOUNTER — Encounter: Payer: Self-pay | Admitting: General Surgery

## 2018-01-17 ENCOUNTER — Ambulatory Visit (INDEPENDENT_AMBULATORY_CARE_PROVIDER_SITE_OTHER): Payer: 59 | Admitting: General Surgery

## 2018-01-17 VITALS — BP 115/76 | HR 87 | Temp 98.0°F | Resp 16 | Wt 181.4 lb

## 2018-01-17 DIAGNOSIS — K8020 Calculus of gallbladder without cholecystitis without obstruction: Secondary | ICD-10-CM | POA: Diagnosis not present

## 2018-01-17 NOTE — H&P (Signed)
Kevin Santos; [id number]; 01/06/1965   HPI Patient is a 53 year old white male who was referred to my care by Dr. Christella Hartigan for evaluation and treatment of cholelithiasis.  Patient states that he has had increasing epigastric pain and heartburn over the past few months.  He is trying to control this with diet.  It is intermittent in nature.  He denies any fever, chills, or jaundice.  The pain usually stays located in the epigastric region.  He currently has 2 out of 10 abdominal pain.  He was treated for H. pylori in the remote past.  Ultrasound of the gallbladder done in June of this year reveals cholelithiasis with a normal common bile duct.  Patient recently had an EGD which showed mild esophagitis.  He was CLOtest positive.  It is recommended that he undergo cholecystectomy. Past Medical History:  Diagnosis Date  . Allergic rhinitis 10/06/2013  . Dyspepsia 2013   treated for H pylori, no EGD, Dr Norma Fredrickson  . GERD (gastroesophageal reflux disease)   . Hyperlipidemia   . Shellfish allergy     Past Surgical History:  Procedure Laterality Date  . SEPTOPLASTY    . WRIST FRACTURE SURGERY      Family History  Problem Relation Age of Onset  . Renal Disease Father        on HD  . Heart disease Father        pacemaker  . Diabetes Other        GP late onset  . Colon cancer Neg Hx   . Prostate cancer Neg Hx     Current Outpatient Medications on File Prior to Visit  Medication Sig Dispense Refill  . doxycycline (DORYX) 100 MG EC tablet Take 100 mg by mouth 2 (two) times daily.    Marland Kitchen EPINEPHrine 0.3 mg/0.3 mL IJ SOAJ injection Inject 0.3 mLs (0.3 mg total) into the muscle once. 1 Device 3  . polyethylene glycol-electrolytes (NULYTELY/GOLYTELY) 420 g solution Take 4,000 mLs by mouth as directed. 4000 mL 0  . predniSONE (DELTASONE) 10 MG tablet Take 10 mg by mouth daily with breakfast.    . rosuvastatin (CRESTOR) 10 MG tablet Take 1 tablet (10 mg total) by mouth daily. 90 tablet 3  . TURMERIC  PO Take by mouth daily.     No current facility-administered medications on file prior to visit.     Allergies  Allergen Reactions  . Shellfish Allergy Other (See Comments)    Lip and mouth swelling  . Ibuprofen Hives  . Influenza Vaccines     Rash-neck-face, lip swelling  . Nsaids     Social History   Substance and Sexual Activity  Alcohol Use Yes   Comment: Occasional    Social History   Tobacco Use  Smoking Status Never Smoker  Smokeless Tobacco Never Used    Review of Systems  Constitutional: Negative.   HENT: Positive for sinus pain.   Eyes: Negative.   Respiratory: Negative.   Cardiovascular: Negative.   Gastrointestinal: Positive for abdominal pain and heartburn.  Genitourinary: Negative.   Musculoskeletal: Negative.   Skin: Negative.   Neurological: Negative.   Endo/Heme/Allergies: Negative.   Psychiatric/Behavioral: Negative.     Objective   Vitals:   09/06/17 0907  BP: (!) 135/91  Pulse: 86  Resp: 16  Temp: (!) 97.3 F (36.3 C)    Physical Exam  Constitutional: He is oriented to person, place, and time. He appears well-developed and well-nourished. No distress.  HENT:  Head: Normocephalic and  atraumatic.  Eyes: No scleral icterus.  Cardiovascular: Normal rate, regular rhythm and normal heart sounds. Exam reveals no gallop and no friction rub.  No murmur heard. Pulmonary/Chest: Effort normal and breath sounds normal. No stridor. No respiratory distress. He has no wheezes. He has no rhonchi. He has no rales.  Abdominal: Soft. Normal appearance and bowel sounds are normal. He exhibits no distension and no mass. There is no hepatosplenomegaly. There is no tenderness. There is no rigidity, no rebound, no guarding and negative Murphy's sign.  Neurological: He is alert and oriented to person, place, and time.  Skin: Skin is warm and dry.  Vitals reviewed. Ultrasound report reviewed  Assessment  Cholelithiasis, biliary colic, H. pylori  infection Plan   Patient is scheduled for laparoscopic cholecystectomy on 01/21/2018.  The risks and benefits of the procedure including bleeding, infection, hepatobiliary injury, and the possibility of an open procedure were fully explained to the patient, who gave informed consent.

## 2018-01-17 NOTE — Progress Notes (Signed)
Subjective:     Kevin Santos  Follow-up for biliary colic secondary to cholelithiasis.  Patient recently had another EGD by gastroenterology and is felt that he needs to proceed with cholecystectomy.  He is receiving another round of treatment for H pylori infection. Objective:    BP 115/76 (BP Location: Left Arm, Patient Position: Sitting, Cuff Size: Normal)   Pulse 87   Temp 98 F (36.7 C) (Temporal)   Resp 16   Wt 181 lb 6.4 oz (82.3 kg)   BMI 27.58 kg/m   General:  alert, cooperative and no distress  Lungs clear to auscultation with good breath sounds bilaterally Heart examination reveals regular rate and rhythm without S3, S4, murmurs Abdomen is soft and flat.  Somewhat uncomfortable to palpation in the epigastric and right upper quadrant region, though it is improved. EGD operative note reviewed     Assessment:    Biliary colic, cholelithiasis   H pylori positive, about to start treatment   Plan:   Patient scheduled for laparoscopic cholecystectomy on 01/21/2018.  The risks and benefits of the procedure including bleeding, infection, hepatobiliary injury, and the possibility of an open procedure were fully explained to the patient, who gave informed consent.

## 2018-01-17 NOTE — Patient Instructions (Signed)
Laparoscopic Cholecystectomy Laparoscopic cholecystectomy is surgery to remove the gallbladder. The gallbladder is a pear-shaped organ that lies beneath the liver on the right side of the body. The gallbladder stores bile, which is a fluid that helps the body to digest fats. Cholecystectomy is often done for inflammation of the gallbladder (cholecystitis). This condition is usually caused by a buildup of gallstones (cholelithiasis) in the gallbladder. Gallstones can block the flow of bile, which can result in inflammation and pain. In severe cases, emergency surgery may be required. This procedure is done though small incisions in your abdomen (laparoscopic surgery). A thin scope with a camera (laparoscope) is inserted through one incision. Thin surgical instruments are inserted through the other incisions. In some cases, a laparoscopic procedure may be turned into a type of surgery that is done through a larger incision (open surgery). Tell a health care provider about:  Any allergies you have.  All medicines you are taking, including vitamins, herbs, eye drops, creams, and over-the-counter medicines.  Any problems you or family members have had with anesthetic medicines.  Any blood disorders you have.  Any surgeries you have had.  Any medical conditions you have.  Whether you are pregnant or may be pregnant. What are the risks? Generally, this is a safe procedure. However, problems may occur, including:  Infection.  Bleeding.  Allergic reactions to medicines.  Damage to other structures or organs.  A stone remaining in the common bile duct. The common bile duct carries bile from the gallbladder into the small intestine.  A bile leak from the cyst duct that is clipped when your gallbladder is removed. What happens before the procedure?   Medicines  Ask your health care provider about: ? Changing or stopping your regular medicines. This is especially important if you are taking  diabetes medicines or blood thinners. ? Taking medicines such as aspirin and ibuprofen. These medicines can thin your blood. Do not take these medicines before your procedure if your health care provider instructs you not to.  You may be given antibiotic medicine to help prevent infection. General instructions  Let your health care provider know if you develop a cold or an infection before surgery.  Plan to have someone take you home from the hospital or clinic.  Ask your health care provider how your surgical site will be marked or identified. What happens during the procedure?   To reduce your risk of infection: ? Your health care team will wash or sanitize their hands. ? Your skin will be washed with soap. ? Hair may be removed from the surgical area.  An IV tube may be inserted into one of your veins.  You will be given one or more of the following: ? A medicine to help you relax (sedative). ? A medicine to make you fall asleep (general anesthetic).  A breathing tube will be placed in your mouth.  Your surgeon will make several small cuts (incisions) in your abdomen.  The laparoscope will be inserted through one of the small incisions. The camera on the laparoscope will send images to a TV screen (monitor) in the operating room. This lets your surgeon see inside your abdomen.  Air-like gas will be pumped into your abdomen. This will expand your abdomen to give the surgeon more room to perform the surgery.  Other tools that are needed for the procedure will be inserted through the other incisions. The gallbladder will be removed through one of the incisions.  Your common bile duct   may be examined. If stones are found in the common bile duct, they may be removed.  After your gallbladder has been removed, the incisions will be closed with stitches (sutures), staples, or skin glue.  Your incisions may be covered with a bandage (dressing). The procedure may vary among health  care providers and hospitals. What happens after the procedure?  Your blood pressure, heart rate, breathing rate, and blood oxygen level will be monitored until the medicines you were given have worn off.  You will be given medicines as needed to control your pain.  Do not drive for 24 hours if you were given a sedative. This information is not intended to replace advice given to you by your health care provider. Make sure you discuss any questions you have with your health care provider. Document Released: 12/19/2004 Document Revised: 11/16/2016 Document Reviewed: 06/07/2015 Elsevier Interactive Patient Education  2019 Else[employer] Inc[employer]  

## 2018-01-18 ENCOUNTER — Encounter (HOSPITAL_COMMUNITY)
Admission: RE | Admit: 2018-01-18 | Discharge: 2018-01-18 | Disposition: A | Discharge status: Home / Self Care | Payer: 59 | Source: Ambulatory Visit | Attending: General Surgery | Admitting: General Surgery

## 2018-01-21 ENCOUNTER — Ambulatory Visit (HOSPITAL_COMMUNITY)
Admission: RE | Admit: 2018-01-21 | Discharge: 2018-01-21 | Disposition: A | Discharge status: Home / Self Care | Payer: 59 | Attending: General Surgery | Admitting: General Surgery

## 2018-01-21 ENCOUNTER — Encounter (HOSPITAL_COMMUNITY): Payer: Self-pay

## 2018-01-21 ENCOUNTER — Ambulatory Visit (HOSPITAL_COMMUNITY): Payer: 59 | Admitting: Anesthesiology

## 2018-01-21 ENCOUNTER — Encounter (HOSPITAL_COMMUNITY)
Admission: RE | Disposition: A | Discharge status: Home / Self Care | Payer: Self-pay | Source: Home / Self Care | Attending: General Surgery

## 2018-01-21 DIAGNOSIS — K8010 Calculus of gallbladder with chronic cholecystitis without obstruction: Secondary | ICD-10-CM | POA: Insufficient documentation

## 2018-01-21 DIAGNOSIS — Z886 Allergy status to analgesic agent status: Secondary | ICD-10-CM | POA: Diagnosis not present

## 2018-01-21 DIAGNOSIS — K8020 Calculus of gallbladder without cholecystitis without obstruction: Secondary | ICD-10-CM | POA: Diagnosis not present

## 2018-01-21 DIAGNOSIS — Z7952 Long term (current) use of systemic steroids: Secondary | ICD-10-CM | POA: Insufficient documentation

## 2018-01-21 DIAGNOSIS — E785 Hyperlipidemia, unspecified: Secondary | ICD-10-CM | POA: Diagnosis not present

## 2018-01-21 DIAGNOSIS — Z79899 Other long term (current) drug therapy: Secondary | ICD-10-CM | POA: Insufficient documentation

## 2018-01-21 DIAGNOSIS — Z91013 Allergy to seafood: Secondary | ICD-10-CM | POA: Diagnosis not present

## 2018-01-21 HISTORY — PX: CHOLECYSTECTOMY: SHX55

## 2018-01-21 SURGERY — LAPAROSCOPIC CHOLECYSTECTOMY
Anesthesia: General

## 2018-01-21 MED ORDER — MEPERIDINE HCL 50 MG/ML IJ SOLN
6.2500 mg | INTRAMUSCULAR | Status: DC | PRN
  Filled 2018-01-21: qty 1

## 2018-01-21 MED ORDER — HYDROCODONE-ACETAMINOPHEN 7.5-325 MG PO TABS: 1.0000 | ORAL_TABLET | Freq: Four times a day (QID) | ORAL | 0 refills | Status: DC | PRN

## 2018-01-21 MED ORDER — POVIDONE-IODINE 10 % EX OINT
TOPICAL_OINTMENT | CUTANEOUS | Status: AC
  Filled 2018-01-21: qty 1

## 2018-01-21 MED ORDER — SUCCINYLCHOLINE CHLORIDE 20 MG/ML IJ SOLN
INTRAMUSCULAR | Status: DC | PRN
  Administered 2018-01-21: 140 mg via INTRAVENOUS

## 2018-01-21 MED ORDER — HEMOSTATIC AGENTS (NO CHARGE) OPTIME
TOPICAL | Status: DC | PRN
  Administered 2018-01-21 (×2): 1 via TOPICAL

## 2018-01-21 MED ORDER — MIDAZOLAM HCL 2 MG/2ML IJ SOLN
INTRAMUSCULAR | Status: AC
  Filled 2018-01-21: qty 2

## 2018-01-21 MED ORDER — DEXAMETHASONE SODIUM PHOSPHATE 10 MG/ML IJ SOLN
INTRAMUSCULAR | Status: AC
  Filled 2018-01-21: qty 1

## 2018-01-21 MED ORDER — MIDAZOLAM HCL 5 MG/5ML IJ SOLN
INTRAMUSCULAR | Status: DC | PRN
  Administered 2018-01-21: 2 mg via INTRAVENOUS

## 2018-01-21 MED ORDER — GLYCOPYRROLATE PF 0.2 MG/ML IJ SOSY
PREFILLED_SYRINGE | INTRAMUSCULAR | Status: DC | PRN
  Administered 2018-01-21: .2 mg via INTRAVENOUS

## 2018-01-21 MED ORDER — PROPOFOL 10 MG/ML IV BOLUS
INTRAVENOUS | Status: DC | PRN
  Administered 2018-01-21: 50 mg via INTRAVENOUS
  Administered 2018-01-21: 150 mg via INTRAVENOUS

## 2018-01-21 MED ORDER — ROCURONIUM BROMIDE 50 MG/5ML IV SOSY
PREFILLED_SYRINGE | INTRAVENOUS | Status: DC | PRN
  Administered 2018-01-21: 10 mg via INTRAVENOUS
  Administered 2018-01-21: 30 mg via INTRAVENOUS
  Administered 2018-01-21: 10 mg via INTRAVENOUS

## 2018-01-21 MED ORDER — LIDOCAINE 2% (20 MG/ML) 5 ML SYRINGE
INTRAMUSCULAR | Status: DC | PRN
  Administered 2018-01-21: 40 mg via INTRAVENOUS

## 2018-01-21 MED ORDER — SODIUM CHLORIDE 0.9 % IR SOLN
Status: DC | PRN
  Administered 2018-01-21: 1000 mL

## 2018-01-21 MED ORDER — SUGAMMADEX SODIUM 200 MG/2ML IV SOLN
INTRAVENOUS | Status: AC
  Filled 2018-01-21: qty 2

## 2018-01-21 MED ORDER — CHLORHEXIDINE GLUCONATE CLOTH 2 % EX PADS: 6.0000 | MEDICATED_PAD | Freq: Once | CUTANEOUS | Status: DC

## 2018-01-21 MED ORDER — FENTANYL CITRATE (PF) 250 MCG/5ML IJ SOLN
INTRAMUSCULAR | Status: AC
  Filled 2018-01-21: qty 5

## 2018-01-21 MED ORDER — BUPIVACAINE LIPOSOME 1.3 % IJ SUSP
INTRAMUSCULAR | Status: DC | PRN
  Administered 2018-01-21: 20 mL

## 2018-01-21 MED ORDER — ONDANSETRON HCL 4 MG PO TABS: 4.0000 mg | ORAL_TABLET | Freq: Three times a day (TID) | ORAL | 1 refills | Status: DC | PRN

## 2018-01-21 MED ORDER — DEXAMETHASONE SODIUM PHOSPHATE 4 MG/ML IJ SOLN
INTRAMUSCULAR | Status: DC | PRN
  Administered 2018-01-21: 10 mg via INTRAVENOUS

## 2018-01-21 MED ORDER — SUGAMMADEX SODIUM 200 MG/2ML IV SOLN
INTRAVENOUS | Status: DC | PRN
  Administered 2018-01-21: 164.6 mg via INTRAVENOUS

## 2018-01-21 MED ORDER — PROPOFOL 10 MG/ML IV BOLUS
INTRAVENOUS | Status: AC
  Filled 2018-01-21: qty 40

## 2018-01-21 MED ORDER — CIPROFLOXACIN IN D5W 400 MG/200ML IV SOLN
400.0000 mg | INTRAVENOUS | Status: AC
  Administered 2018-01-21: 400 mg via INTRAVENOUS
  Filled 2018-01-21: qty 200

## 2018-01-21 MED ORDER — LACTATED RINGERS IV SOLN
INTRAVENOUS | Status: DC
  Administered 2018-01-21: 07:00:00 via INTRAVENOUS

## 2018-01-21 MED ORDER — ONDANSETRON HCL 4 MG/2ML IJ SOLN
INTRAMUSCULAR | Status: AC
  Filled 2018-01-21: qty 2

## 2018-01-21 MED ORDER — ONDANSETRON HCL 4 MG/2ML IJ SOLN
INTRAMUSCULAR | Status: DC | PRN
  Administered 2018-01-21: 4 mg via INTRAVENOUS

## 2018-01-21 MED ORDER — ONDANSETRON HCL 4 MG/2ML IJ SOLN
4.0000 mg | Freq: Once | INTRAMUSCULAR | Status: AC | PRN
  Administered 2018-01-21: 4 mg via INTRAVENOUS
  Filled 2018-01-21: qty 2

## 2018-01-21 MED ORDER — HYDROCODONE-ACETAMINOPHEN 7.5-325 MG PO TABS: 1.0000 | ORAL_TABLET | Freq: Once | ORAL | Status: DC | PRN

## 2018-01-21 MED ORDER — BUPIVACAINE LIPOSOME 1.3 % IJ SUSP
INTRAMUSCULAR | Status: AC
  Filled 2018-01-21: qty 20

## 2018-01-21 MED ORDER — FENTANYL CITRATE (PF) 100 MCG/2ML IJ SOLN
INTRAMUSCULAR | Status: DC | PRN
  Administered 2018-01-21: 100 ug via INTRAVENOUS
  Administered 2018-01-21 (×4): 50 ug via INTRAVENOUS

## 2018-01-21 MED ORDER — HYDROMORPHONE HCL 1 MG/ML IJ SOLN
0.2500 mg | INTRAMUSCULAR | Status: DC | PRN
  Administered 2018-01-21: 0.5 mg via INTRAVENOUS
  Filled 2018-01-21 (×2): qty 0.5

## 2018-01-21 MED ORDER — POVIDONE-IODINE 10 % OINT PACKET
TOPICAL_OINTMENT | CUTANEOUS | Status: DC | PRN
  Administered 2018-01-21: 1 via TOPICAL

## 2018-01-21 MED FILL — HYDROCODON-APAP 7.5-325: 7.5-325 | 7 days supply | Qty: 25 | Fill #0

## 2018-01-21 MED FILL — ONDANSETRON HCL 4 MG TABLET: 4 | 7 days supply | Qty: 20 | Fill #0

## 2018-01-21 SURGICAL SUPPLY — 47 items
APL SRG 38 LTWT LNG FL B (MISCELLANEOUS) ×1
APPLICATOR ARISTA FLEXITIP XL (MISCELLANEOUS) ×1 IMPLANT
APPLIER CLIP ROT 10 11.4 M/L (STAPLE) ×2
BAG RETRIEVAL 10 (BASKET) ×1
CHLORAPREP W/TINT 26ML (MISCELLANEOUS) ×2 IMPLANT
CLIP APPLIE ROT 10 11.4 M/L (STAPLE) ×1 IMPLANT
CLOTH BEACON ORANGE TIMEOUT ST (SAFETY) ×2 IMPLANT
COVER LIGHT HANDLE STERIS (MISCELLANEOUS) ×4 IMPLANT
COVER WAND RF STERILE (DRAPES) ×1 IMPLANT
ELECT REM PT RETURN 9FT ADLT (ELECTROSURGICAL) ×2
ELECTRODE REM PT RTRN 9FT ADLT (ELECTROSURGICAL) ×1 IMPLANT
FILTER SMOKE EVAC LAPAROSHD (FILTER) ×2 IMPLANT
GLOVE BIO SURGEON STRL SZ7 (GLOVE) ×1 IMPLANT
GLOVE BIOGEL PI IND STRL 7.0 (GLOVE) ×1 IMPLANT
GLOVE BIOGEL PI INDICATOR 7.0 (GLOVE) ×1
GLOVE ECLIPSE 6.5 STRL STRAW (GLOVE) ×1 IMPLANT
GLOVE SURG SS PI 7.5 STRL IVOR (GLOVE) ×2 IMPLANT
GOWN STRL REUS W/ TWL XL LVL3 (GOWN DISPOSABLE) ×1 IMPLANT
GOWN STRL REUS W/TWL LRG LVL3 (GOWN DISPOSABLE) ×4 IMPLANT
GOWN STRL REUS W/TWL XL LVL3 (GOWN DISPOSABLE) ×1
HEMOSTAT ARISTA ABSORB 3G PWDR (HEMOSTASIS) ×1 IMPLANT
HEMOSTAT SNOW SURGICEL 2X4 (HEMOSTASIS) ×2 IMPLANT
INST SET LAPROSCOPIC AP (KITS) ×2 IMPLANT
KIT TURNOVER KIT A (KITS) ×2 IMPLANT
MANIFOLD NEPTUNE II (INSTRUMENTS) ×2 IMPLANT
NDL HYPO 18GX1.5 BLUNT FILL (NEEDLE) ×1 IMPLANT
NDL INSUFFLATION 14GA 120MM (NEEDLE) ×1 IMPLANT
NEEDLE HYPO 18GX1.5 BLUNT FILL (NEEDLE) ×4 IMPLANT
NEEDLE HYPO 22GX1.5 SAFETY (NEEDLE) ×2 IMPLANT
NEEDLE INSUFFLATION 14GA 120MM (NEEDLE) ×2 IMPLANT
NS IRRIG 1000ML POUR BTL (IV SOLUTION) ×2 IMPLANT
PACK LAP CHOLE LZT030E (CUSTOM PROCEDURE TRAY) ×2 IMPLANT
PAD ARMBOARD 7.5X6 YLW CONV (MISCELLANEOUS) ×2 IMPLANT
SET BASIN LINEN APH (SET/KITS/TRAYS/PACK) ×2 IMPLANT
SLEEVE ENDOPATH XCEL 5M (ENDOMECHANICALS) ×2 IMPLANT
SPONGE GAUZE 2X2 8PLY STRL LF (GAUZE/BANDAGES/DRESSINGS) ×8 IMPLANT
STAPLER VISISTAT (STAPLE) ×2 IMPLANT
SUT VICRYL 0 UR6 27IN ABS (SUTURE) ×3 IMPLANT
SYR 20CC LL (SYRINGE) ×2 IMPLANT
SYS BAG RETRIEVAL 10MM (BASKET) ×1
SYSTEM BAG RETRIEVAL 10MM (BASKET) ×1 IMPLANT
TROCAR ENDO BLADELESS 11MM (ENDOMECHANICALS) ×2 IMPLANT
TROCAR XCEL NON-BLD 5MMX100MML (ENDOMECHANICALS) ×2 IMPLANT
TROCAR XCEL UNIV SLVE 11M 100M (ENDOMECHANICALS) ×2 IMPLANT
TUBE CONNECTING 12X1/4 (SUCTIONS) ×2 IMPLANT
TUBING INSUFFLATION (TUBING) ×2 IMPLANT
WARMER LAPAROSCOPE (MISCELLANEOUS) ×2 IMPLANT

## 2018-01-21 NOTE — Transfer of Care (Signed)
Immediate Anesthesia Transfer of Care Note  Patient: Kevin Santos  Procedure(s) Performed: LAPAROSCOPIC CHOLECYSTECTOMY (N/A )  Patient Location: PACU  Anesthesia Type:General  Level of Consciousness: awake, oriented and patient cooperative  Airway & Oxygen Therapy: Patient Spontanous Breathing  Post-op Assessment: Report given to RN and Post -op Vital signs reviewed and stable  Post vital signs: Reviewed and stable  Last Vitals:  Vitals Value Taken Time  BP 136/96 01/21/2018  8:47 AM  Temp    Pulse 85 01/21/2018  8:49 AM  Resp 17 01/21/2018  8:49 AM  SpO2 92 % 01/21/2018  8:49 AM  Vitals shown include unvalidated device data.  Last Pain:  Vitals:   01/21/18 0703  TempSrc: Oral  PainSc: 0-No pain      Patients Stated Pain Goal: 6 (01/21/18 0703)  Complications: No apparent anesthesia complications

## 2018-01-21 NOTE — Op Note (Signed)
Patient:  Kevin Santos  DOB:  14-Jan-1965  MRN:  [id number]   Preop Diagnosis: Biliary colic, cholelithiasis  Postop Diagnosis: Same  Procedure: Laparoscopic cholecystectomy  Surgeon: Franky Macho, MD  Anes: General endotracheal  Indications: Patient is a 53 year old male who presents with biliary colic secondary to cholelithiasis.  The risks and benefits of the procedure including bleeding, infection, hepatobiliary injury, and the possibility of an open procedure were fully explained to the patient, who gave informed consent.  Procedure note: The patient was placed in the supine position.  After induction of general endotracheal anesthesia, the abdomen was prepped and draped using the usual sterile technique with ChloraPrep.  Surgical site confirmation was performed.  An infraumbilical incision was made down to the fascia.  A Veress needle was introduced into the abdominal cavity and confirmation of placement was done using the saline drop test.  The abdomen was then insufflated to 15 mmHg pressure.  An 11 mm trocar was introduced into the abdominal cavity under direct visualization without difficulty.  The patient was placed in reverse Trendelenburg position and an additional millimeter trocar was placed in the epigastric region and 5 mm trochars were placed the right upper quadrant and right flank regions.  Liver was inspected and noted to be within normal limits.  Gallbladder was noted to be distended with a thickened gallbladder wall.  The gallbladder was then retracted in a dynamic fashion in order to provide a critical view of the triangle of Calot.  The cystic duct was first identified.  Its juncture to the infundibulum was fully identified.  Endoclips were placed proximally and distally on the cystic duct, and the cystic duct was divided.  This was likewise done the cystic artery.  The gallbladder was freed away from the gallbladder fossa using Bovie electrocautery.  The gallbladder  was delivered through the epigastric trocar site using an Endo Catch bag.  The gallbladder fossa was inspected and no abnormal bleeding or bile leakage was noted.  Arista and Surgicel were placed in the gallbladder fossa.  All fluid and air were then evacuated from the abdominal cavity prior to the removal of the trochars.  All wounds were irrigated with normal saline.  All wounds were injected with Exparel.  The infraumbilical fascia as well as epigastric fascia were reapproximated using 0 Vicryl interrupted sutures.  All skin incisions were closed using staples.  Betadine ointment and dry sterile dressings were applied.  All tape and needle counts were correct at the end of the procedure.  Patient was extubated in the operating room and transferred to PACU in stable condition.  Complications: None  EBL: Minimal  Specimen: Gallbladder

## 2018-01-21 NOTE — Anesthesia Preprocedure Evaluation (Signed)
Anesthesia Evaluation  Patient identified by MRN, date of birth, ID band Patient awake    Reviewed: Allergy & Precautions, H&P , NPO status , Patient's Chart, lab work & pertinent test results  Airway Mallampati: II  TM Distance: <3 FB Neck ROM: full    Dental no notable dental hx.    Pulmonary neg pulmonary ROS,    Pulmonary exam normal breath sounds clear to auscultation       Cardiovascular Exercise Tolerance: Good negative cardio ROS   Rhythm:regular Rate:Normal     Neuro/Psych negative neurological ROS  negative psych ROS   GI/Hepatic Neg liver ROS, GERD  ,  Endo/Other  negative endocrine ROS  Renal/GU negative Renal ROS  negative genitourinary   Musculoskeletal   Abdominal   Peds  Hematology negative hematology ROS (+)   Anesthesia Other Findings   Reproductive/Obstetrics negative OB ROS                             Anesthesia Physical Anesthesia Plan  ASA: II  Anesthesia Plan: General   Post-op Pain Management:    Induction:   PONV Risk Score and Plan:   Airway Management Planned:   Additional Equipment:   Intra-op Plan:   Post-operative Plan:   Informed Consent: I have reviewed the patients History and Physical, chart, labs and discussed the procedure including the risks, benefits and alternatives for the proposed anesthesia with the patient or authorized representative who has indicated his/her understanding and acceptance.     Dental Advisory Given  Plan Discussed with: CRNA  Anesthesia Plan Comments:         Anesthesia Quick Evaluation

## 2018-01-21 NOTE — Anesthesia Postprocedure Evaluation (Signed)
Anesthesia Post Note  Patient: Kevin Santos  Procedure(s) Performed: LAPAROSCOPIC CHOLECYSTECTOMY (N/A )  Patient location during evaluation: PACU Anesthesia Type: General Level of consciousness: awake and alert Pain management: pain level controlled Vital Signs Assessment: post-procedure vital signs reviewed and stable Respiratory status: spontaneous breathing Cardiovascular status: stable Postop Assessment: no apparent nausea or vomiting Anesthetic complications: no     Last Vitals:  Vitals:   01/21/18 0703 01/21/18 0847  BP: 128/88 (!) 136/96  Pulse:  85  Resp: 16 16  Temp: 36.9 C 36.9 C  SpO2: 94% 91%    Last Pain:  Vitals:   01/21/18 0847  TempSrc:   PainSc: Asleep                 ADAMS, AMY A

## 2018-01-21 NOTE — Anesthesia Procedure Notes (Signed)
Procedure Name: Intubation Date/Time: 01/21/2018 7:36 AM Performed by: Pernell Dupre, Amy A, CRNA Pre-anesthesia Checklist: Patient identified, Patient being monitored, Timeout performed, Emergency Drugs available and Suction available Patient Re-evaluated:Patient Re-evaluated prior to induction Oxygen Delivery Method: Circle system utilized Preoxygenation: Pre-oxygenation with 100% oxygen Induction Type: IV induction Ventilation: Mask ventilation without difficulty Laryngoscope Size: 3 and Miller Grade View: Grade II Tube type: Oral Tube size: 7.0 mm Number of attempts: 1 Airway Equipment and Method: Stylet Placement Confirmation: ETT inserted through vocal cords under direct vision,  positive ETCO2 and breath sounds checked- equal and bilateral Secured at: 21 cm Tube secured with: Tape Dental Injury: Teeth and Oropharynx as per pre-operative assessment

## 2018-01-21 NOTE — Interval H&P Note (Signed)
History and Physical Interval Note:  01/21/2018 7:14 AM  Kevin Santos  has presented today for surgery, with the diagnosis of cholelithiasis  The various methods of treatment have been discussed with the patient and family. After consideration of risks, benefits and other options for treatment, the patient has consented to  Procedure(s): LAPAROSCOPIC CHOLECYSTECTOMY (N/A) as a surgical intervention .  The patient's history has been reviewed, patient examined, no change in status, stable for surgery.  I have reviewed the patient's chart and labs.  Questions were answered to the patient's satisfaction.     Franky MachoMark Tanieka Pownall

## 2018-01-21 NOTE — Discharge Instructions (Signed)
Laparoscopic Cholecystectomy, Care After  This sheet gives you information about how to care for yourself after your procedure. Your health care provider may also give you more specific instructions. If you have problems or questions, contact your health care provider.  What can I expect after the procedure?  After the procedure, it is common to have:   Pain at your incision sites. You will be given medicines to control this pain.   Mild nausea or vomiting.    Bloating and possible shoulder pain from the air-like gas that was used during the procedure.  Follow these instructions at home:  Incision care     Follow instructions from your health care provider about how to take care of your incisions. Make sure you:  ? Wash your hands with soap and water before you change your bandage (dressing). If soap and water are not available, use hand sanitizer.  ? Change your dressing as told by your health care provider.  ? Leave stitches (sutures), skin glue, or adhesive strips in place. These skin closures may need to be in place for 2 weeks or longer. If adhesive strip edges start to loosen and curl up, you may trim the loose edges. Do not remove adhesive strips completely unless your health care provider tells you to do that.   Do not take baths, swim, or use a hot tub until your health care provider approves. Ask your health care provider if you can take showers. You may only be allowed to take sponge baths for bathing.   Check your incision area every day for signs of infection. Check for:  ? More redness, swelling, or pain.  ? More fluid or blood.  ? Warmth.  ? Pus or a bad smell.  Activity   Do not drive or use heavy machinery while taking prescription pain medicine.   Do not lift anything that is heavier than 10 lb (4.5 kg) until your health care provider approves.   Do not play contact sports until your health care provider approves.   Do not drive for 24 hours if you were given a medicine to help you relax  (sedative).   Rest as needed. Do not return to work or school until your health care provider approves.  General instructions   Take over-the-counter and prescription medicines only as told by your health care provider.   To prevent or treat constipation while you are taking prescription pain medicine, your health care provider may recommend that you:  ? Drink enough fluid to keep your urine clear or pale yellow.  ? Take over-the-counter or prescription medicines.  ? Eat foods that are high in fiber, such as fresh fruits and vegetables, whole grains, and beans.  ? Limit foods that are high in fat and processed sugars, such as fried and sweet foods.  Contact a health care provider if:   You develop a rash.   You have more redness, swelling, or pain around your incisions.   You have more fluid or blood coming from your incisions.   Your incisions feel warm to the touch.   You have pus or a bad smell coming from your incisions.   You have a fever.   One or more of your incisions breaks open.  Get help right away if:   You have trouble breathing.   You have chest pain.   You have increasing pain in your shoulders.   You faint or feel dizzy when you stand.   You have   severe pain in your abdomen.   You have nausea or vomiting that lasts for more than one day.   You have leg pain.  This information is not intended to replace advice given to you by your health care provider. Make sure you discuss any questions you have with your health care provider.  Document Released: 12/19/2004 Document Revised: 07/10/2015 Document Reviewed: 06/07/2015  Elsevier Interactive Patient Education  2019 [employer] Inc[employer]

## 2018-01-22 ENCOUNTER — Encounter (HOSPITAL_COMMUNITY): Payer: Self-pay | Admitting: General Surgery

## 2018-01-29 ENCOUNTER — Ambulatory Visit (INDEPENDENT_AMBULATORY_CARE_PROVIDER_SITE_OTHER): Payer: Self-pay | Admitting: General Surgery

## 2018-01-29 ENCOUNTER — Encounter: Payer: Self-pay | Admitting: General Surgery

## 2018-01-29 VITALS — BP 132/92 | HR 86 | Temp 97.8°F | Resp 20 | Wt 180.4 lb

## 2018-01-29 DIAGNOSIS — Z09 Encounter for follow-up examination after completed treatment for conditions other than malignant neoplasm: Secondary | ICD-10-CM

## 2018-01-29 NOTE — Progress Notes (Signed)
Subjective:     Kevin Santos  Here for surgery follow-up.  Patient feels much better's.  His states his preoperative symptoms have resolved.  He does occasionally feel a little lightheaded, but he is also taking antibiotics for H pylori infection.  He denies any nausea, vomiting, jaundice, diarrhea, constipation, or abdominal pain.  He is not taking the narcotics.  No fever or chills have been noted.  He will be finishing his antibiotic course in a few days. Objective:    BP (!) 132/92 (BP Location: Left Arm, Patient Position: Sitting, Cuff Size: Normal)   Pulse 86   Temp 97.8 F (36.6 C) (Temporal)   Resp 20   Wt 180 lb 6.4 oz (81.8 kg)   BMI 27.43 kg/m   General:  alert, cooperative and no distress  Abdomen soft, incisions healing well.  Staples removed, Steri-Strips applied. Final pathology consistent with diagnosis.     Assessment:    Doing well postoperatively.    Plan:   Doubt his intermittent lightheadedness related to surgery.  May return to work without restrictions on 01/30/2018.  Follow-up here as needed.

## 2018-02-05 ENCOUNTER — Ambulatory Visit: Payer: 59 | Admitting: General Surgery

## 2018-04-08 ENCOUNTER — Encounter: Payer: Self-pay | Admitting: Internal Medicine

## 2018-05-21 ENCOUNTER — Encounter: Payer: Self-pay | Admitting: Internal Medicine

## 2018-07-27 ENCOUNTER — Encounter: Payer: Self-pay | Admitting: Internal Medicine

## 2018-09-17 ENCOUNTER — Telehealth: Payer: Self-pay

## 2018-09-17 NOTE — Telephone Encounter (Signed)
LMOM informing Pt that letter is ready-placing in mail as requested.

## 2018-09-17 NOTE — Telephone Encounter (Signed)
Copied from [place] [id number]. Topic: General - Other >> Sep 17, 2018  1:42 PM Pauline Good wrote: Reason for CRM: pt need a letter stating he is allergic to the flu vaccine to give to his job. Please call pt when ready and mail it to him also

## 2018-09-17 NOTE — Telephone Encounter (Signed)
Letter printed, awaiting PCP signature.

## 2018-09-20 DIAGNOSIS — F419 Anxiety disorder, unspecified: Secondary | ICD-10-CM | POA: Diagnosis not present

## 2018-10-18 DIAGNOSIS — R0683 Snoring: Secondary | ICD-10-CM | POA: Diagnosis not present

## 2018-10-18 DIAGNOSIS — J301 Allergic rhinitis due to pollen: Secondary | ICD-10-CM | POA: Diagnosis not present

## 2018-10-18 DIAGNOSIS — J3489 Other specified disorders of nose and nasal sinuses: Secondary | ICD-10-CM | POA: Diagnosis not present

## 2018-11-12 DIAGNOSIS — H40013 Open angle with borderline findings, low risk, bilateral: Secondary | ICD-10-CM | POA: Diagnosis not present

## 2018-11-12 MED FILL — AMOXICILLIN 250 MG CAPSULE: 250 | 10 days supply | Qty: 40 | Fill #0

## 2019-04-15 DIAGNOSIS — N400 Enlarged prostate without lower urinary tract symptoms: Secondary | ICD-10-CM | POA: Diagnosis not present

## 2019-04-15 DIAGNOSIS — Z0000 Encounter for general adult medical examination without abnormal findings: Secondary | ICD-10-CM | POA: Diagnosis not present

## 2019-04-15 DIAGNOSIS — Z23 Encounter for immunization: Secondary | ICD-10-CM | POA: Diagnosis not present

## 2019-04-15 DIAGNOSIS — Z131 Encounter for screening for diabetes mellitus: Secondary | ICD-10-CM | POA: Diagnosis not present

## 2019-04-15 DIAGNOSIS — Z1211 Encounter for screening for malignant neoplasm of colon: Secondary | ICD-10-CM | POA: Diagnosis not present

## 2019-04-15 DIAGNOSIS — F419 Anxiety disorder, unspecified: Secondary | ICD-10-CM | POA: Diagnosis not present

## 2019-04-15 DIAGNOSIS — H40013 Open angle with borderline findings, low risk, bilateral: Secondary | ICD-10-CM | POA: Diagnosis not present

## 2019-05-23 IMAGING — CT CT ABD-PELV W/ CM
2 of 5 series · 17 of 46 positions shown, 19 images · IV contrast (APPLIED)
Comparison: Ultrasound abdomen 06/26/2017

CLINICAL DATA: Blunt epigastric pain and indigestion 2 hours ago.
Nausea and vomiting.

EXAM:
CT ABDOMEN AND PELVIS WITH CONTRAST
TECHNIQUE: Multidetector CT imaging of the abdomen and pelvis was performed
using the standard protocol following bolus administration of
intravenous contrast.
CONTRAST:  100mL OMNIPAQUE IOHEXOL 300 MG/ML  SOLN

[Series 3: abd/ pelvis 5.0 i30f 2 · axial · 0.77mm/px · z∈[+919,+1344]mm · 14 of 96 slices shown, 16 images]
[im 6/96  soft-tissue]
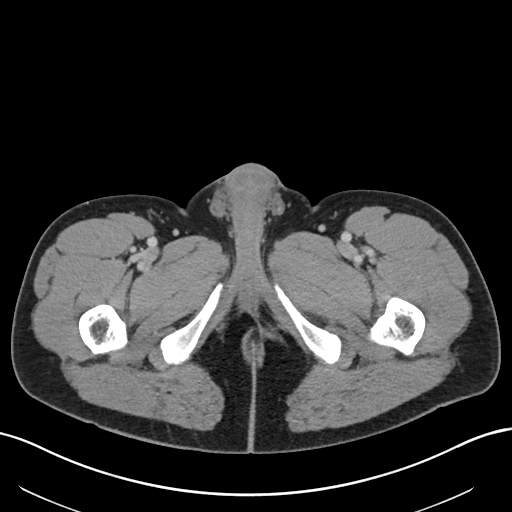
[im 6/96  bone]
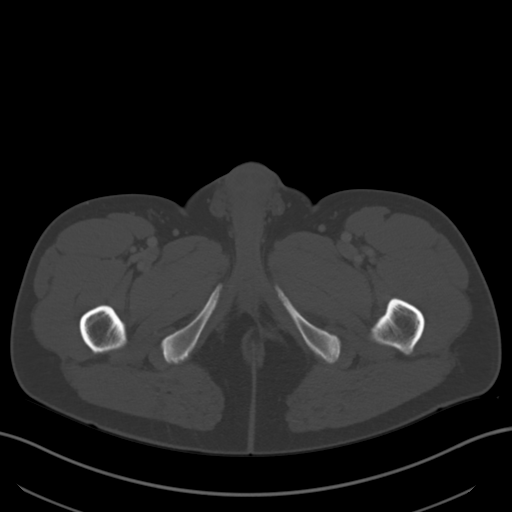
[im 11/96  soft-tissue]
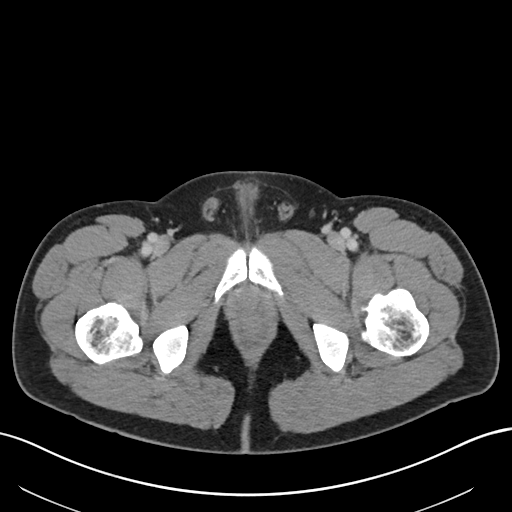
[im 21/96  soft-tissue]
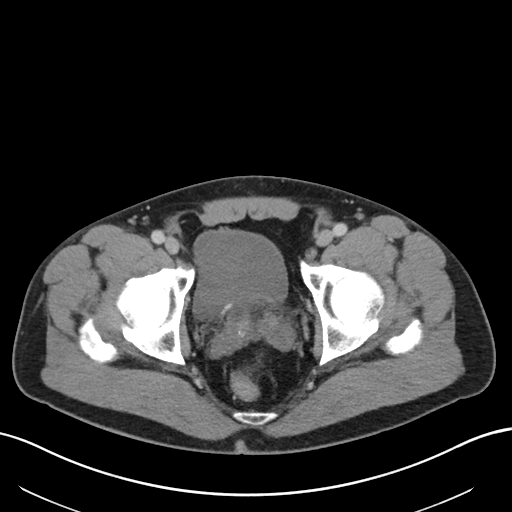
[im 26/96  soft-tissue]
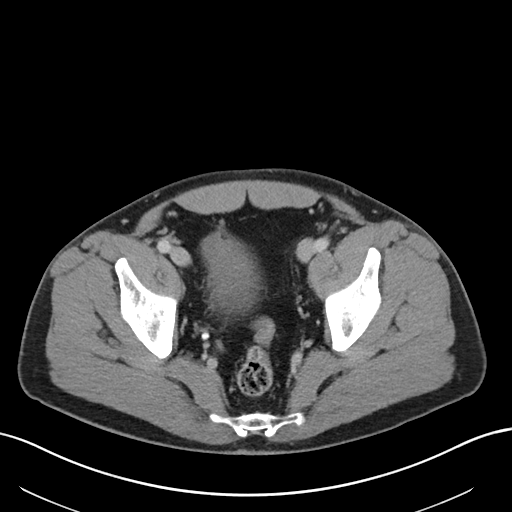
[im 31/96  soft-tissue]
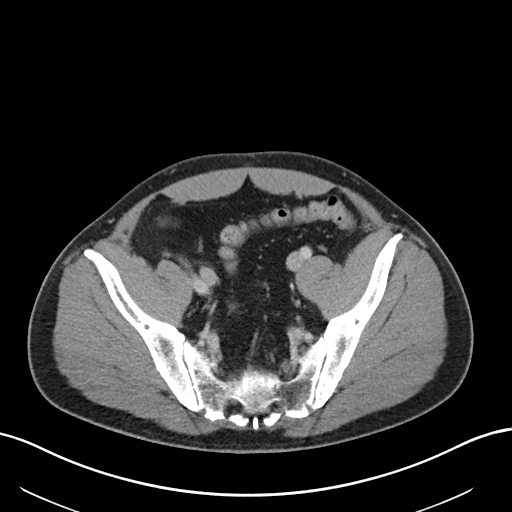
[im 41/96  soft-tissue]
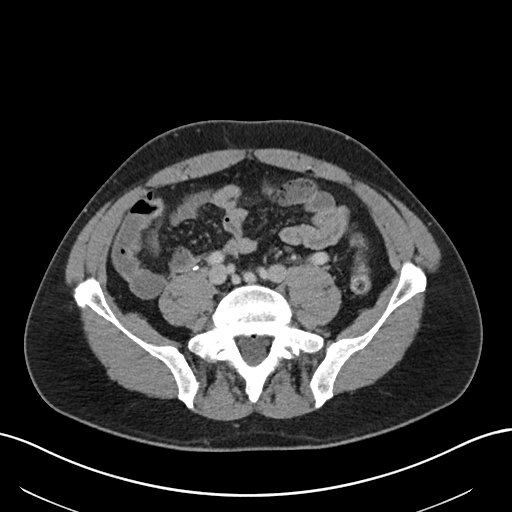
[im 46/96  soft-tissue]
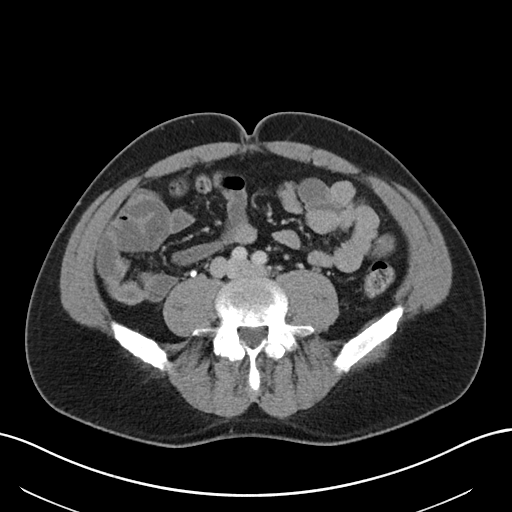
[im 51/96  soft-tissue]
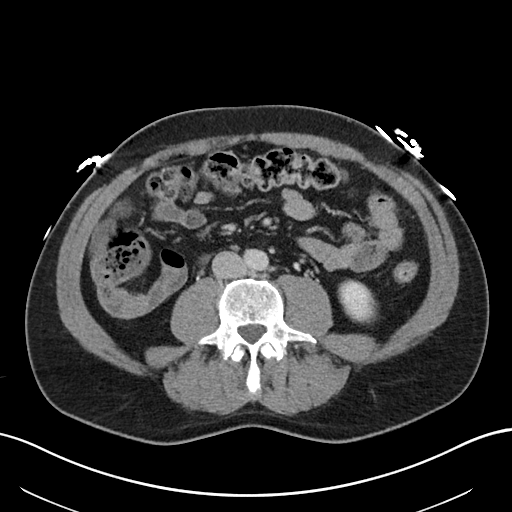
[im 56/96  soft-tissue]
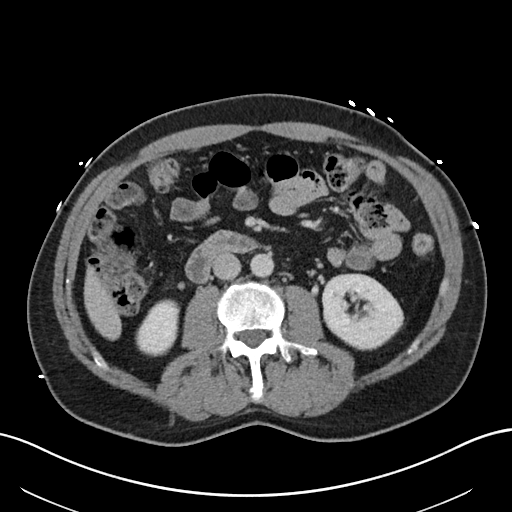
[im 56/96  bone]
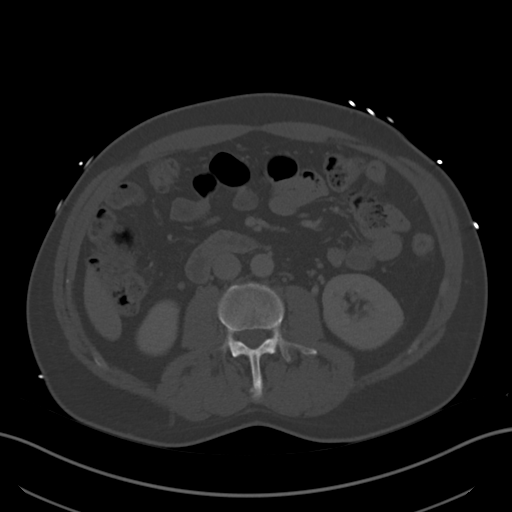
[im 66/96  soft-tissue]
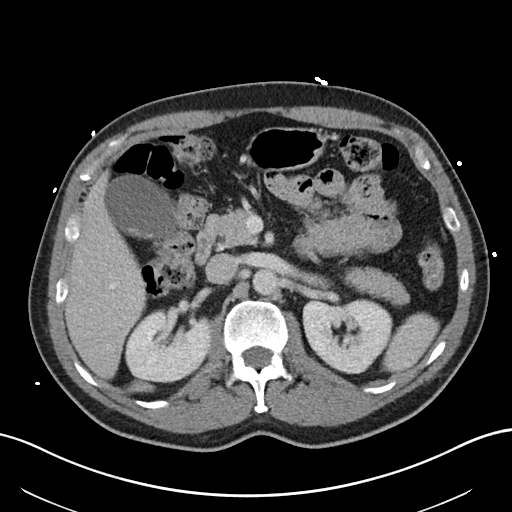
[im 71/96  soft-tissue]
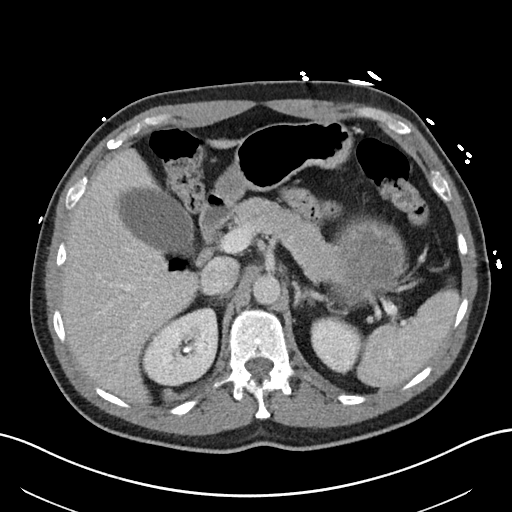
[im 76/96  soft-tissue]
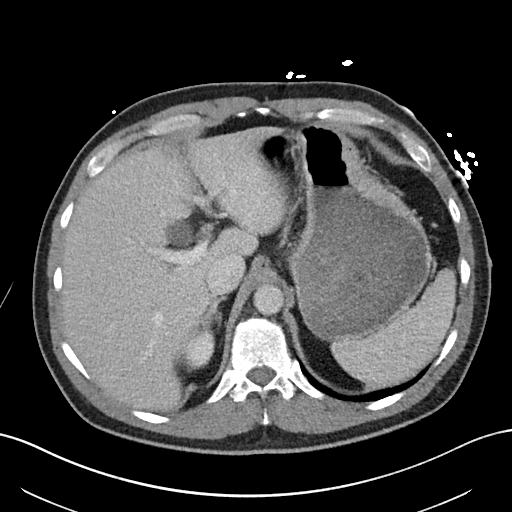
[im 86/96  soft-tissue]
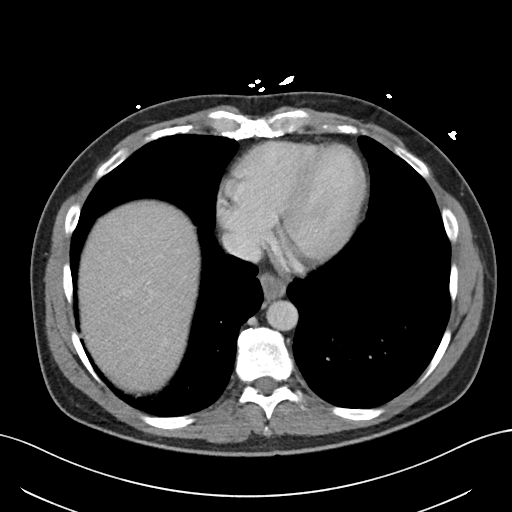
[im 91/96  soft-tissue]
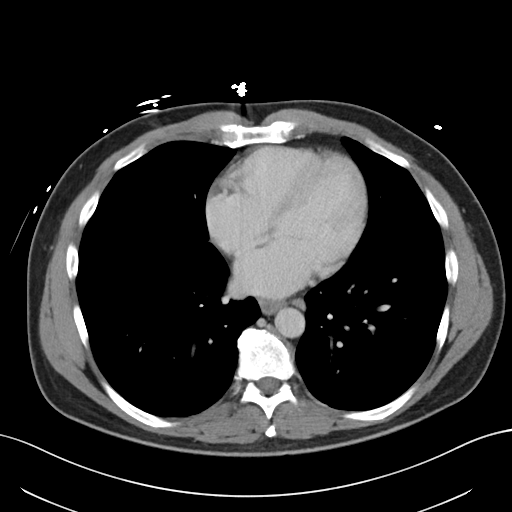

[Series 6: coronal soft tissue · coronal · 0.93mm/px · 3 of 101 slices shown]
[im 34/101  soft-tissue]
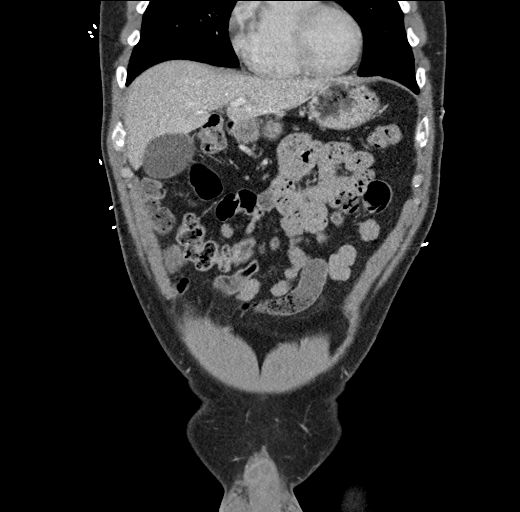
[im 45/101  soft-tissue]
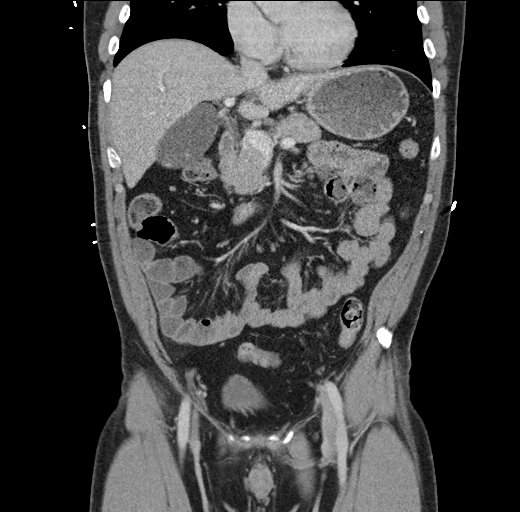
[im 56/101  soft-tissue]
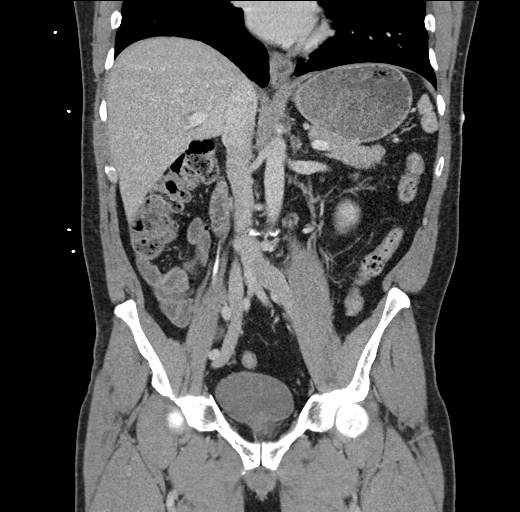

[17 of 46 positions shown; findings below may reference images not displayed]

FINDINGS: Lower chest: Lung bases are clear. Fluid in the lower esophagus
without distention may represent reflux.

Hepatobiliary: Cholelithiasis. No other inflammatory changes in the
gallbladder. No bile duct dilatation. No focal liver lesions.

Pancreas: Unremarkable. No pancreatic ductal dilatation or
surrounding inflammatory changes.

Spleen: Normal in size without focal abnormality.

Adrenals/Urinary Tract: Adrenal glands are unremarkable. Kidneys are
normal, without renal calculi, focal lesion, or hydronephrosis.
Bladder is unremarkable.

Stomach/Bowel: Stomach is within normal limits. Appendix appears
normal. No evidence of bowel wall thickening, distention, or
inflammatory changes.

Vascular/Lymphatic: No significant vascular findings are present. No
enlarged abdominal or pelvic lymph nodes.

Reproductive: Prostate gland is enlarged, measuring 5 cm diameter.

Other: No free air or free fluid in the abdomen. Abdominal wall
musculature appears intact.

Musculoskeletal: Degenerative changes in the spine.
IMPRESSION: Cholelithiasis. No evidence of bowel obstruction or inflammation.
Prostate gland is enlarged.

## 2019-05-24 IMAGING — US US ABDOMEN LIMITED
1 series · 14 of 25 positions shown · non-contrast
Comparison: CT abdomen/pelvis dated 01/13/2018

CLINICAL DATA: Right upper quadrant abdominal pain, gallstones,
elevated LFTs

EXAM:
ULTRASOUND ABDOMEN LIMITED RIGHT UPPER QUADRANT

[Series 1: us abdomen limited · 14 of 49 slices shown]
[im 1/49]
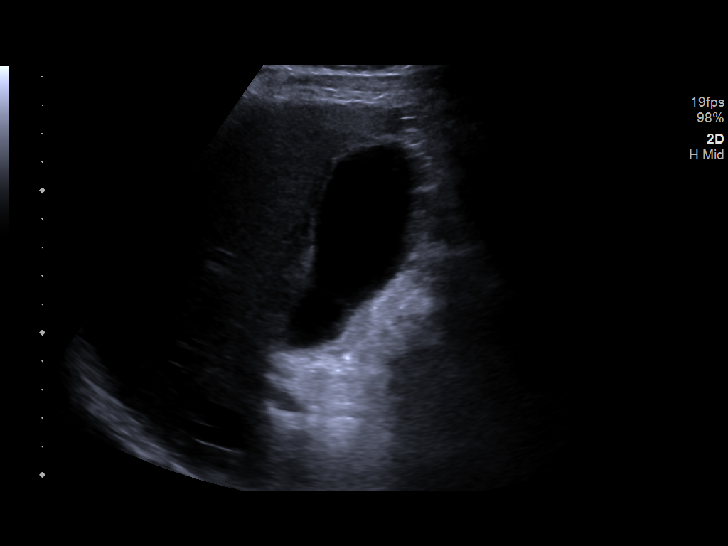
[im 5/49]
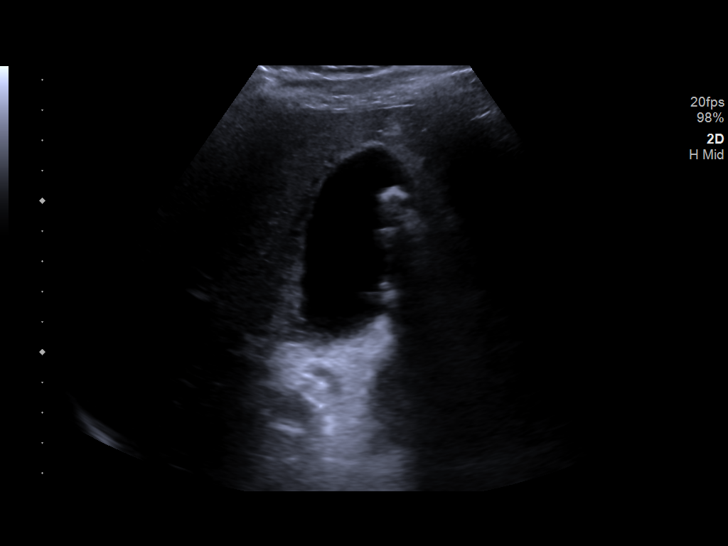
[im 9/49]
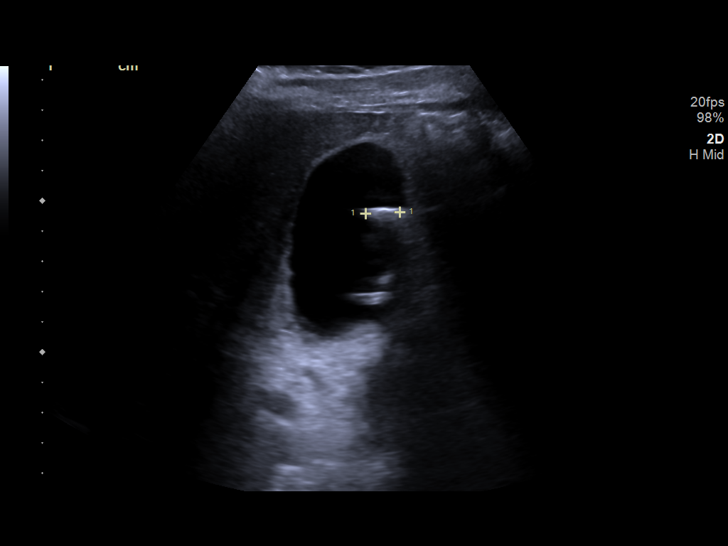
[im 13/49]
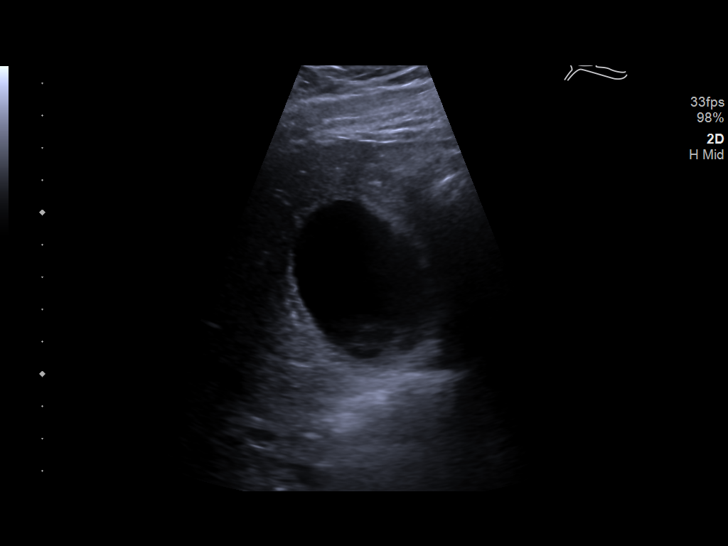
[im 17/49]
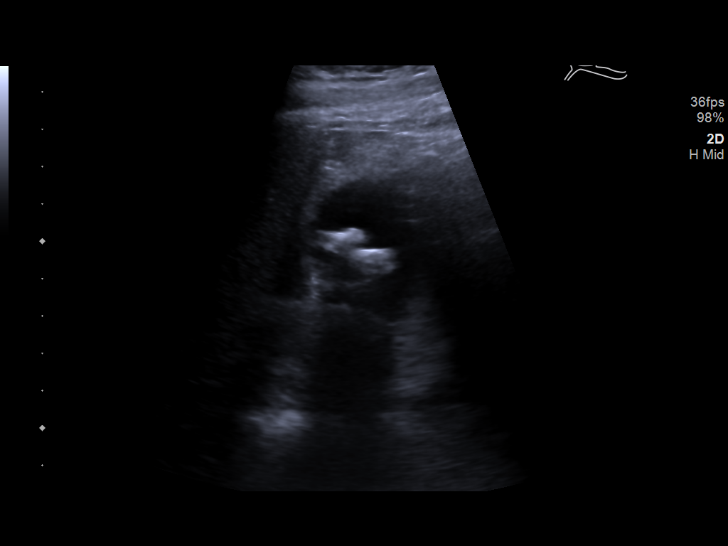
[im 19/49]
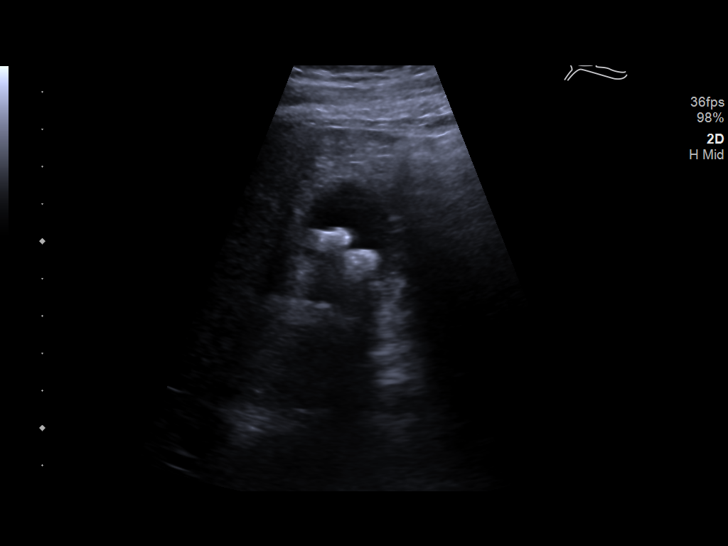
[im 23/49]
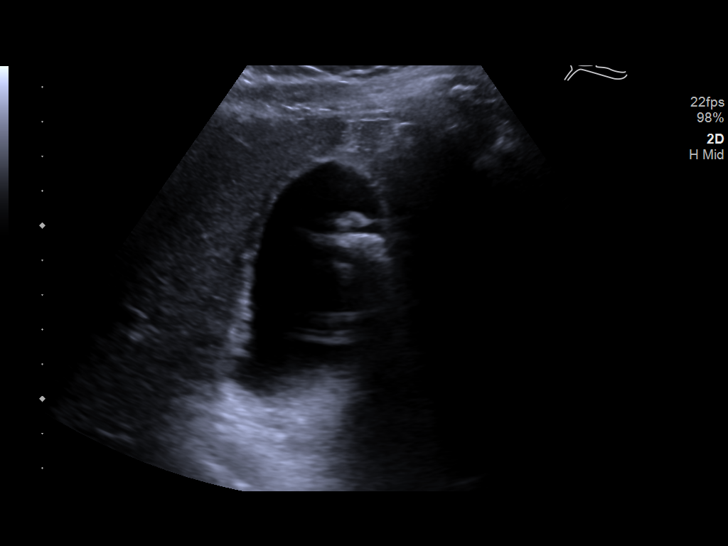
[im 27/49]
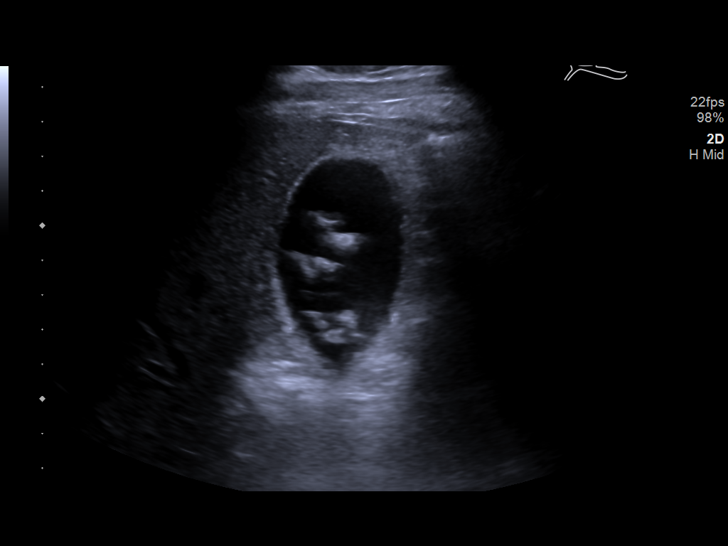
[im 31/49]
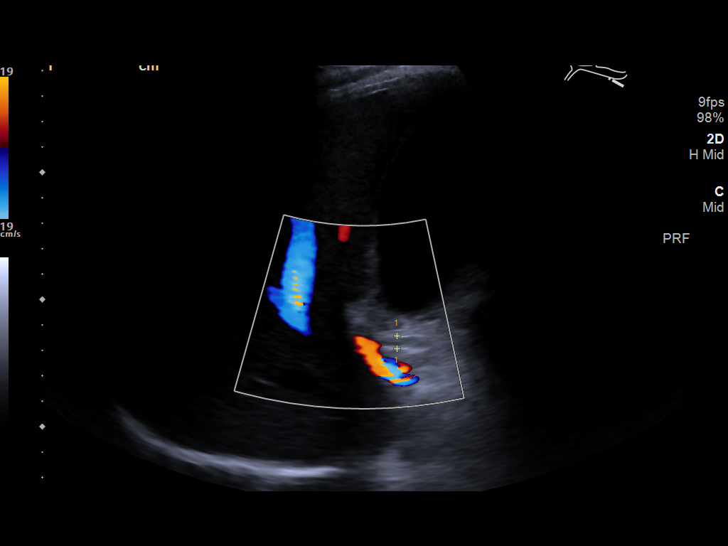
[im 33/49]
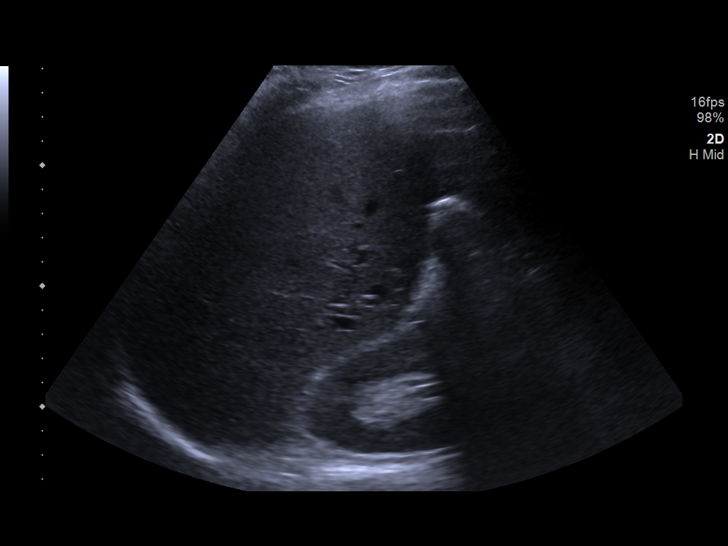
[im 37/49]
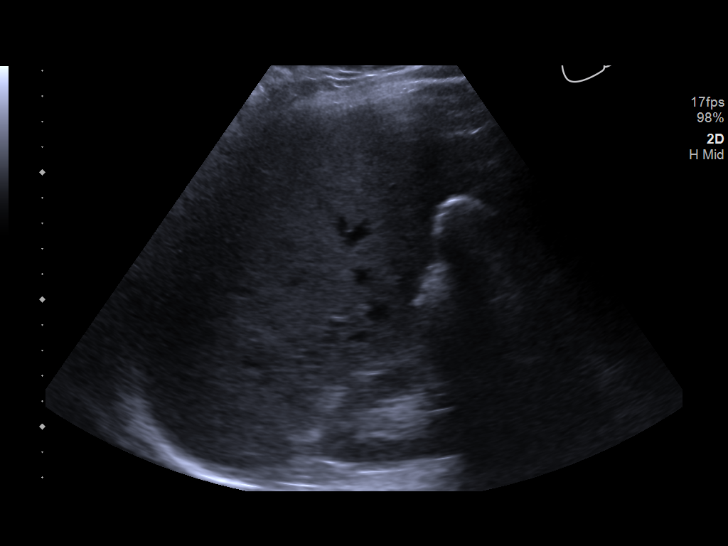
[im 41/49]
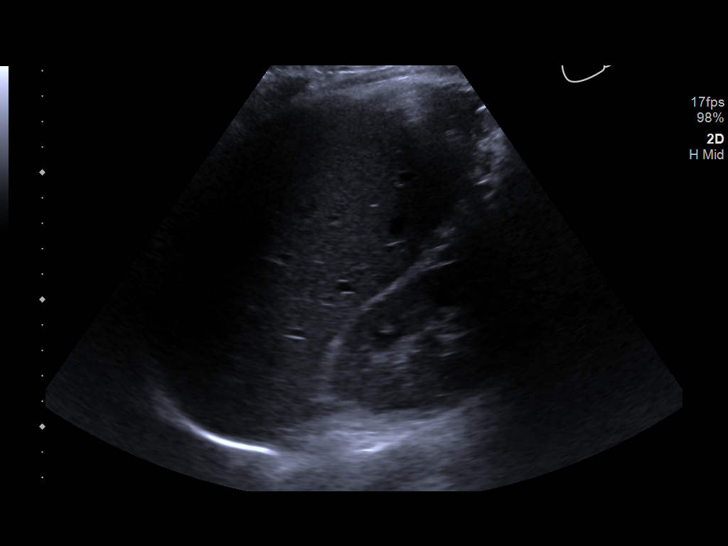
[im 45/49]
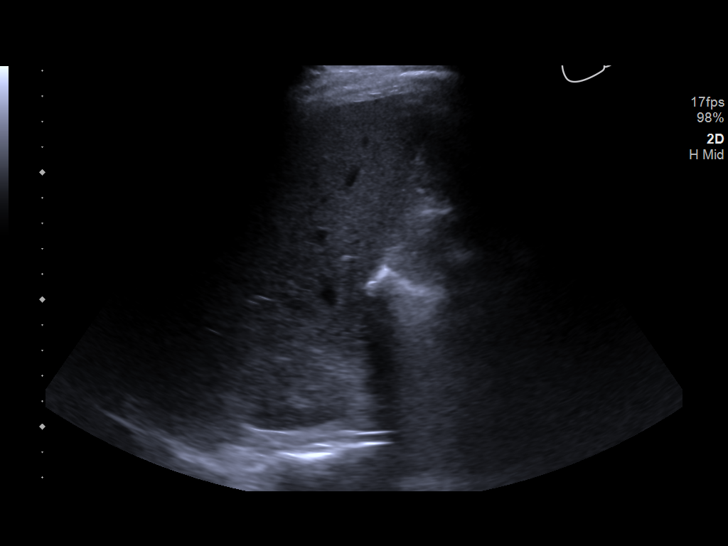
[im 49/49]
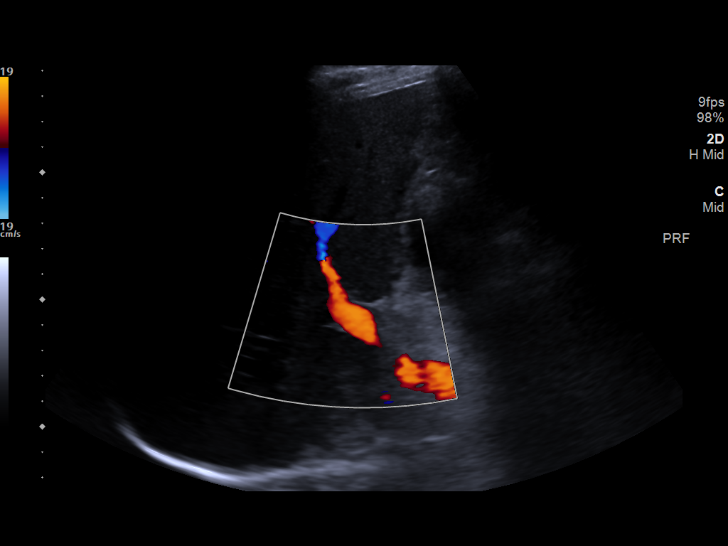

[14 of 25 positions shown; findings below may reference images not displayed]

FINDINGS: Gallbladder:

Multiple gallstones, measuring up to 12 mm. No gallbladder wall
thickening. Negative sonographic Murphy's sign.

Common bile duct:

Diameter: 5 mm

Liver:

No focal lesion identified. Within normal limits in parenchymal
echogenicity. Portal vein is patent on color Doppler imaging with
normal direction of blood flow towards the liver.
IMPRESSION: Cholelithiasis, without associated sonographic findings to suggest
acute cholecystitis.

## 2019-06-05 DIAGNOSIS — F419 Anxiety disorder, unspecified: Secondary | ICD-10-CM | POA: Diagnosis not present

## 2019-06-16 DIAGNOSIS — K648 Other hemorrhoids: Secondary | ICD-10-CM | POA: Diagnosis not present

## 2019-06-16 DIAGNOSIS — Z1211 Encounter for screening for malignant neoplasm of colon: Secondary | ICD-10-CM | POA: Diagnosis not present

## 2019-09-03 ENCOUNTER — Telehealth: Payer: Self-pay | Admitting: Internal Medicine

## 2019-09-03 NOTE — Telephone Encounter (Signed)
Please advise 

## 2019-09-03 NOTE — Telephone Encounter (Signed)
Caller: [facility] Call Back # [phone]   Patient states need a letter from Pcp to exempt him from taking the flu shot.Patient states he has been allergic to flu vaccines for ever.    Please Advise

## 2019-09-08 NOTE — Telephone Encounter (Signed)
Okay to provide a letter for influenza shot exception

## 2019-09-09 NOTE — Telephone Encounter (Signed)
Letter printed, awaiting MD signature.

## 2019-09-09 NOTE — Telephone Encounter (Signed)
LMOM informing Pt that letter is ready for pick up at front desk.  

## 2019-10-26 IMAGING — US US ABDOMEN COMPLETE
1 series · 14 of 25 positions shown · non-contrast
Comparison: Ultrasound report 11/22/2011

CLINICAL DATA: Epigastric pain over the last 15 years.

EXAM:
ABDOMEN ULTRASOUND COMPLETE

[Series 1: us abdomen complete · 0.25mm/px · 14 of 121 slices shown]
[im 1/121]
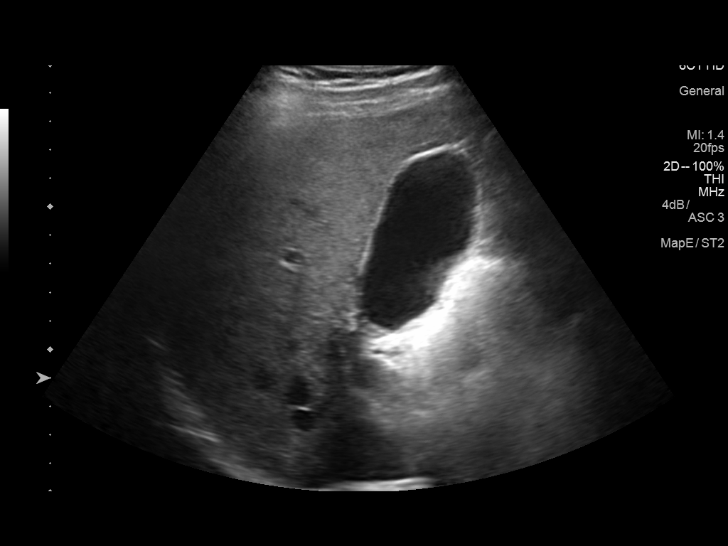
[im 11/121]
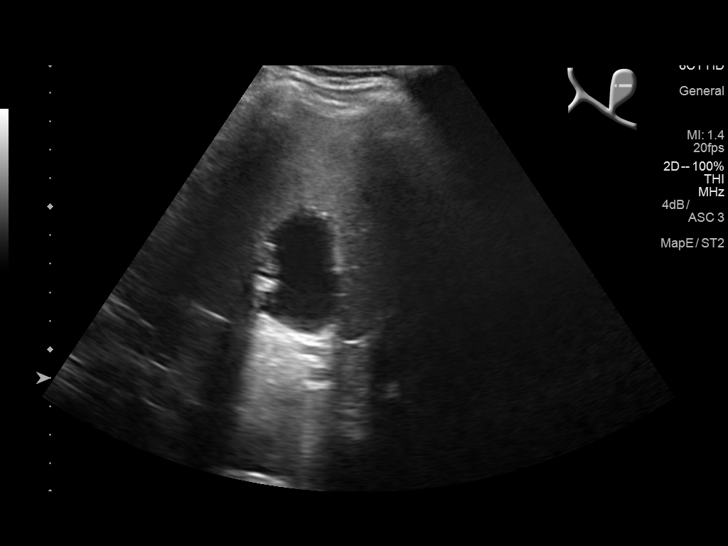
[im 21/121]
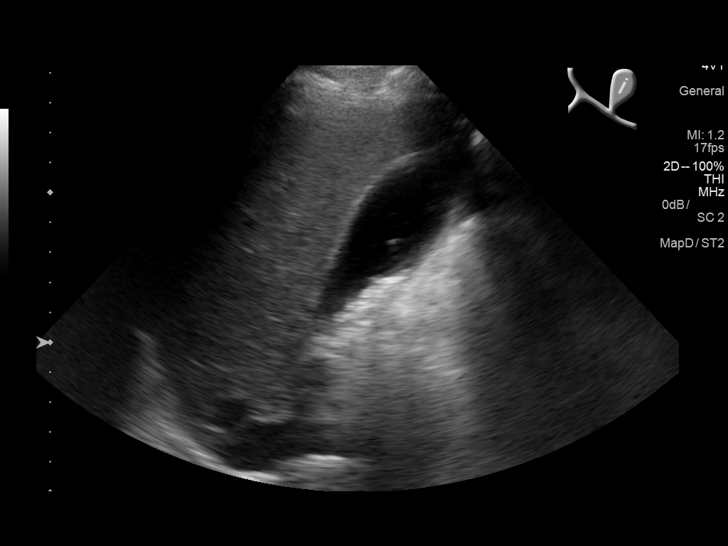
[im 31/121]
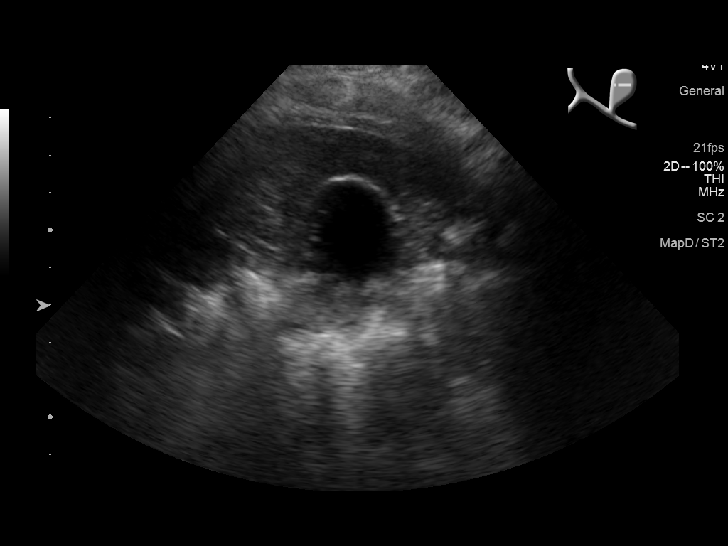
[im 41/121]
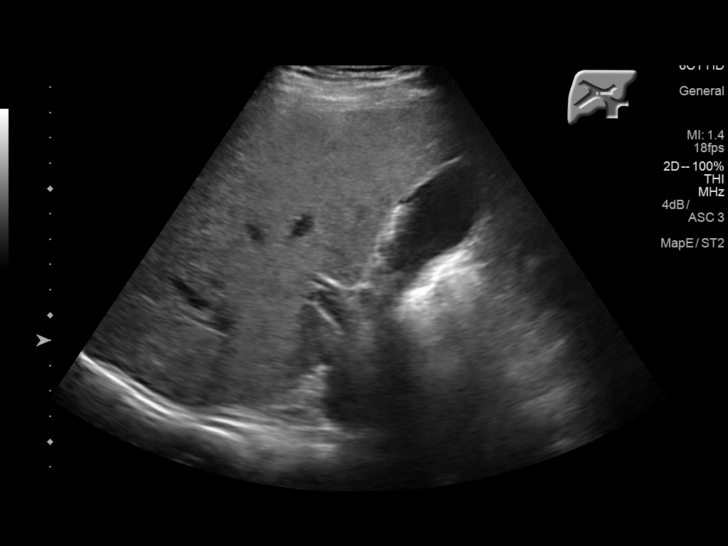
[im 46/121]
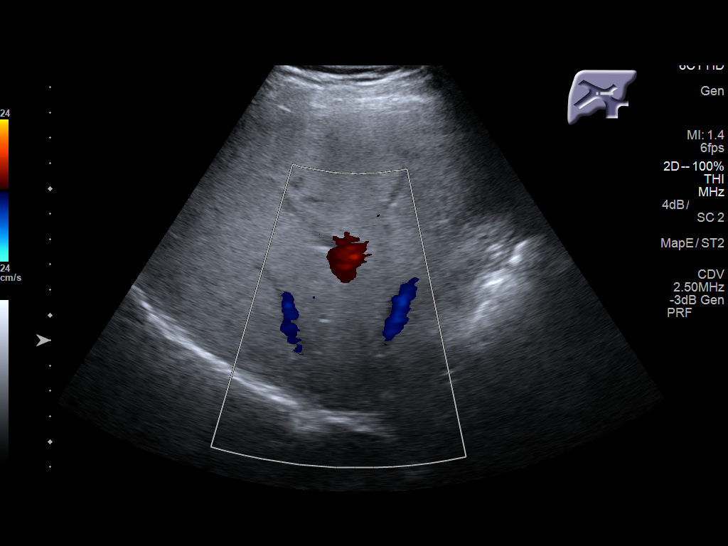
[im 56/121]
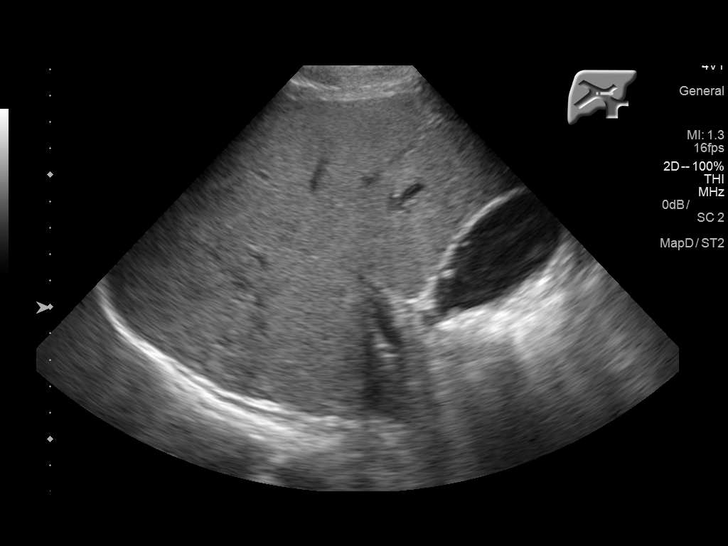
[im 66/121]
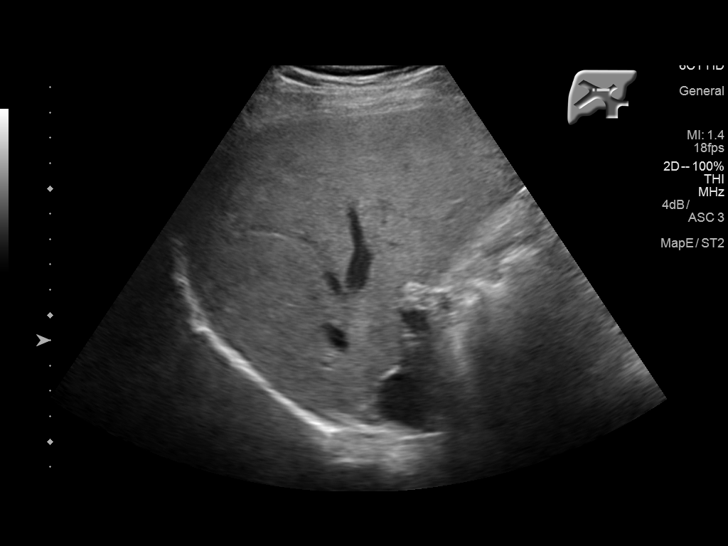
[im 76/121]
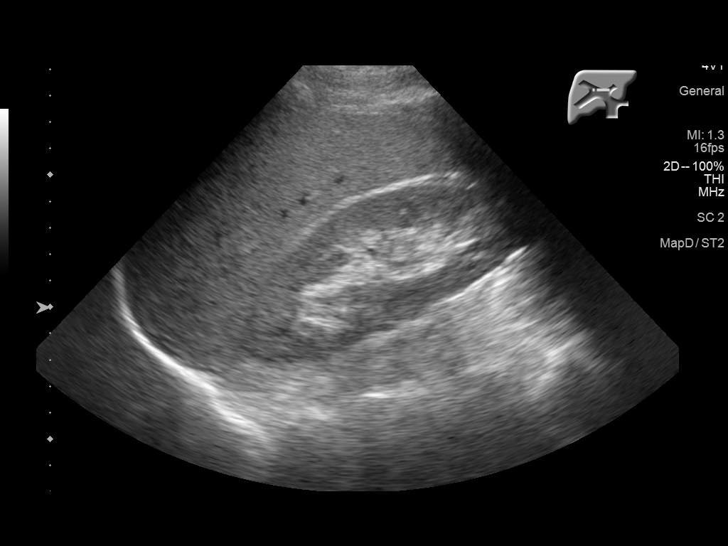
[im 81/121]
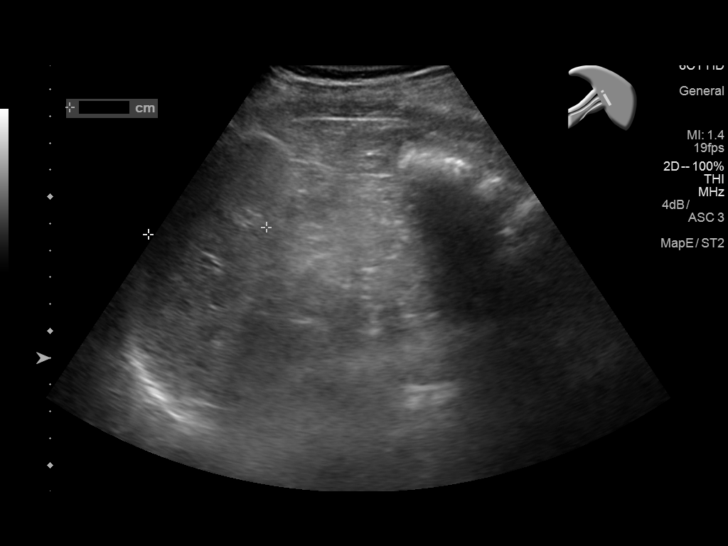
[im 91/121]
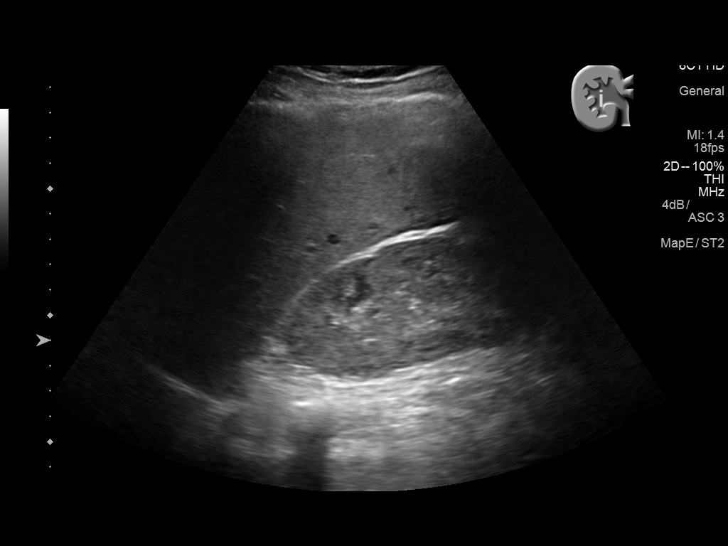
[im 101/121]
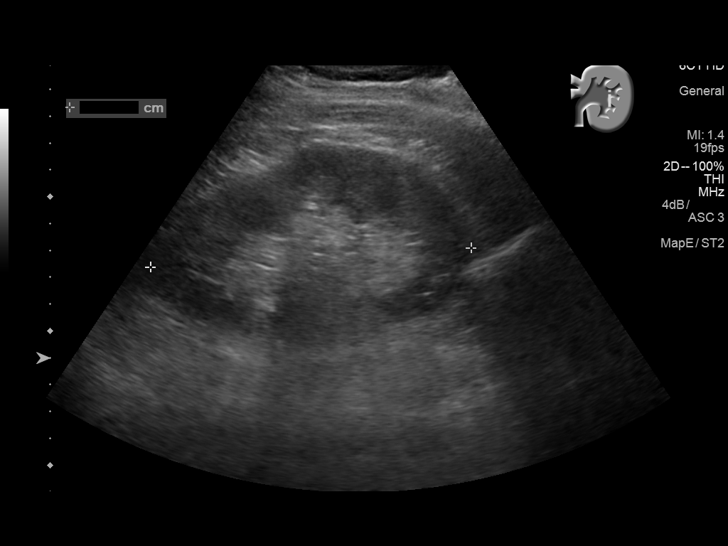
[im 111/121]
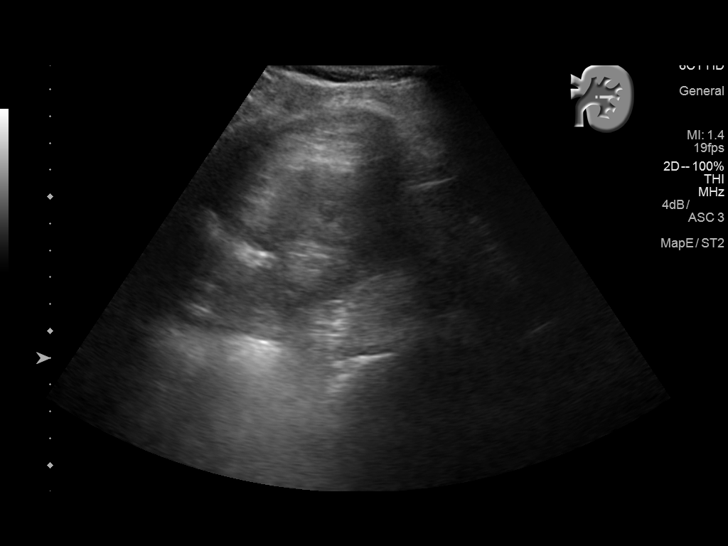
[im 121/121]
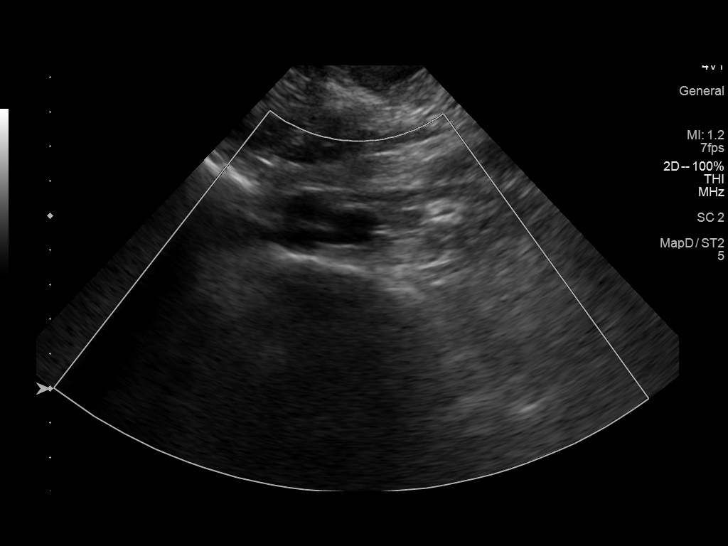

[14 of 25 positions shown; findings below may reference images not displayed]

FINDINGS: Gallbladder: Multiple stones in the gallbladder layering
dependently, the largest 1.5 cm. No wall thickening or surrounding
fluid. Negative Murphy sign.

Common bile duct: Diameter: 4 mm common normal

Liver: No focal lesion identified. Within normal limits in
parenchymal echogenicity. Portal vein is patent on color Doppler
imaging with normal direction of blood flow towards the liver.

IVC: No abnormality visualized.

Pancreas: Not seen because of overlying bowel gas.

Spleen: Size and appearance within normal limits.

Right Kidney: Length: 12.3 cm. Echogenicity within normal limits. No
mass or hydronephrosis visualized.

Left Kidney: Length: 12.0 cm. Echogenicity within normal limits. No
mass or hydronephrosis visualized.

Abdominal aorta: No aneurysm visualized.

Other findings: No ascites
IMPRESSION: Chololithiasis, newly seen since the examination of 9581. Multiple
stones dependent in the gallbladder, the largest 1.5 cm. No sign of
cholecystitis or obstruction presently.

## 2020-03-18 ENCOUNTER — Encounter: Payer: Self-pay | Admitting: Internal Medicine

## 2020-05-05 ENCOUNTER — Other Ambulatory Visit (HOSPITAL_BASED_OUTPATIENT_CLINIC_OR_DEPARTMENT_OTHER): Payer: Self-pay

## 2020-05-05 MED ORDER — EPINEPHRINE 0.3 MG/0.3ML IJ SOAJ
INTRAMUSCULAR | 1 refills | Status: AC
  Filled 2020-05-05: qty 2, 1d supply, fill #0

## 2020-05-12 ENCOUNTER — Other Ambulatory Visit (HOSPITAL_BASED_OUTPATIENT_CLINIC_OR_DEPARTMENT_OTHER): Payer: Self-pay

## 2021-06-30 ENCOUNTER — Other Ambulatory Visit (HOSPITAL_BASED_OUTPATIENT_CLINIC_OR_DEPARTMENT_OTHER): Payer: Self-pay

## 2021-06-30 MED ORDER — EPINEPHRINE 0.3 MG/0.3ML IJ SOAJ
INTRAMUSCULAR | 1 refills | Status: AC
  Filled 2021-06-30 – 2021-07-01 (×2): qty 2, 2d supply, fill #0
  Filled 2021-08-22: qty 2, 2d supply, fill #1

## 2021-06-30 MED ORDER — TAMSULOSIN HCL 0.4 MG PO CAPS
ORAL_CAPSULE | ORAL | 5 refills | Status: AC
  Filled 2021-06-30: qty 30, 30d supply, fill #0

## 2021-07-01 ENCOUNTER — Other Ambulatory Visit (HOSPITAL_BASED_OUTPATIENT_CLINIC_OR_DEPARTMENT_OTHER): Payer: Self-pay

## 2021-07-01 MED ORDER — ROSUVASTATIN CALCIUM 20 MG PO TABS
ORAL_TABLET | ORAL | 1 refills | Status: AC
  Filled 2021-07-01: qty 90, 90d supply, fill #0

## 2021-07-04 ENCOUNTER — Other Ambulatory Visit (HOSPITAL_BASED_OUTPATIENT_CLINIC_OR_DEPARTMENT_OTHER): Payer: Self-pay

## 2021-08-22 ENCOUNTER — Other Ambulatory Visit (HOSPITAL_BASED_OUTPATIENT_CLINIC_OR_DEPARTMENT_OTHER): Payer: Self-pay

## 2021-08-23 ENCOUNTER — Other Ambulatory Visit (HOSPITAL_BASED_OUTPATIENT_CLINIC_OR_DEPARTMENT_OTHER): Payer: Self-pay

## 2022-10-02 ENCOUNTER — Other Ambulatory Visit (HOSPITAL_BASED_OUTPATIENT_CLINIC_OR_DEPARTMENT_OTHER): Payer: Self-pay

## 2022-10-02 MED ORDER — ROSUVASTATIN CALCIUM 20 MG PO TABS
20.0000 mg | ORAL_TABLET | Freq: Every day | ORAL | 1 refills | Status: AC
  Filled 2022-10-02: qty 90, 90d supply, fill #0

## 2022-10-04 ENCOUNTER — Other Ambulatory Visit (HOSPITAL_BASED_OUTPATIENT_CLINIC_OR_DEPARTMENT_OTHER): Payer: Self-pay
